# Patient Record
Sex: Female | Born: 1990 | Race: White | Hispanic: No | Marital: Married | Smoking: Current some day smoker
Health system: Southern US, Community
[De-identification: ages and names within clinical notes are randomized; demographics above are authoritative.]

## PROBLEM LIST (undated history)

## (undated) ENCOUNTER — Inpatient Hospital Stay (HOSPITAL_COMMUNITY): Payer: Self-pay

## (undated) DIAGNOSIS — O139 Gestational [pregnancy-induced] hypertension without significant proteinuria, unspecified trimester: Secondary | ICD-10-CM

## (undated) DIAGNOSIS — Z789 Other specified health status: Secondary | ICD-10-CM

## (undated) HISTORY — PX: CHOLECYSTECTOMY: SHX55

---
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
## 2018-09-22 ENCOUNTER — Encounter: Payer: Self-pay | Admitting: Family Medicine

## 2018-09-22 ENCOUNTER — Other Ambulatory Visit: Payer: Self-pay

## 2018-09-22 ENCOUNTER — Ambulatory Visit (INDEPENDENT_AMBULATORY_CARE_PROVIDER_SITE_OTHER): Payer: Self-pay | Admitting: *Deleted

## 2018-09-22 DIAGNOSIS — Z3200 Encounter for pregnancy test, result unknown: Secondary | ICD-10-CM

## 2018-09-22 DIAGNOSIS — Z3201 Encounter for pregnancy test, result positive: Secondary | ICD-10-CM

## 2018-09-22 LAB — POCT PREGNANCY, URINE: PREG TEST UR: POSITIVE — AB

## 2018-09-22 NOTE — Progress Notes (Signed)
Patient ID: Emily [name], female   DOB: [birth year], 28 y.o.   MRN: [id number] I have reviewed the chart and agree with nursing staff's documentation of this patient's encounter.  Scheryl Darter, MD 09/22/2018 11:31 AM

## 2018-09-22 NOTE — Progress Notes (Signed)
Here for pregnancy test which was positive. States LMP 07/25/18 which makes her [redacted]w[redacted]d with EDD 05/01/19. Advised to start prenatal care at 10 weeks or so. And to start prenatal vitamins. She voices understanding. Jeanita Carneiro,RN

## 2018-09-28 ENCOUNTER — Inpatient Hospital Stay (HOSPITAL_COMMUNITY)
Admission: AD | Admit: 2018-09-28 | Discharge: 2018-09-28 | Disposition: A | Discharge status: Home / Self Care | Payer: Medicaid Other | Attending: Obstetrics & Gynecology | Admitting: Obstetrics & Gynecology

## 2018-09-28 ENCOUNTER — Encounter (HOSPITAL_COMMUNITY): Payer: Self-pay

## 2018-09-28 ENCOUNTER — Inpatient Hospital Stay (HOSPITAL_COMMUNITY): Payer: Medicaid Other

## 2018-09-28 DIAGNOSIS — O219 Vomiting of pregnancy, unspecified: Secondary | ICD-10-CM | POA: Diagnosis not present

## 2018-09-28 DIAGNOSIS — O98811 Other maternal infectious and parasitic diseases complicating pregnancy, first trimester: Secondary | ICD-10-CM | POA: Diagnosis not present

## 2018-09-28 DIAGNOSIS — B3731 Acute candidiasis of vulva and vagina: Secondary | ICD-10-CM | POA: Diagnosis present

## 2018-09-28 DIAGNOSIS — Z3490 Encounter for supervision of normal pregnancy, unspecified, unspecified trimester: Secondary | ICD-10-CM

## 2018-09-28 DIAGNOSIS — R109 Unspecified abdominal pain: Secondary | ICD-10-CM | POA: Insufficient documentation

## 2018-09-28 DIAGNOSIS — R1031 Right lower quadrant pain: Secondary | ICD-10-CM | POA: Insufficient documentation

## 2018-09-28 DIAGNOSIS — O26899 Other specified pregnancy related conditions, unspecified trimester: Secondary | ICD-10-CM

## 2018-09-28 DIAGNOSIS — B373 Candidiasis of vulva and vagina: Secondary | ICD-10-CM | POA: Insufficient documentation

## 2018-09-28 DIAGNOSIS — Z3A09 9 weeks gestation of pregnancy: Secondary | ICD-10-CM | POA: Diagnosis not present

## 2018-09-28 DIAGNOSIS — Z87891 Personal history of nicotine dependence: Secondary | ICD-10-CM | POA: Diagnosis not present

## 2018-09-28 DIAGNOSIS — O26891 Other specified pregnancy related conditions, first trimester: Secondary | ICD-10-CM | POA: Diagnosis present

## 2018-09-28 HISTORY — DX: Other specified health status: Z78.9

## 2018-09-28 LAB — WET PREP, GENITAL
Clue Cells Wet Prep HPF POC: NONE SEEN
Sperm: NONE SEEN
Trich, Wet Prep: NONE SEEN

## 2018-09-28 LAB — URINALYSIS, ROUTINE W REFLEX MICROSCOPIC
Bilirubin Urine: NEGATIVE
Glucose, UA: NEGATIVE mg/dL
Hgb urine dipstick: NEGATIVE
Ketones, ur: NEGATIVE mg/dL
LEUKOCYTE UA: NEGATIVE
NITRITE: NEGATIVE
Protein, ur: NEGATIVE mg/dL
SPECIFIC GRAVITY, URINE: 1.01 (ref 1.005–1.030)
pH: 7 (ref 5.0–8.0)

## 2018-09-28 LAB — CBC
HEMATOCRIT: 42.8 % (ref 36.0–46.0)
HEMOGLOBIN: 14.2 g/dL (ref 12.0–15.0)
MCH: 30.7 pg (ref 26.0–34.0)
MCHC: 33.2 g/dL (ref 30.0–36.0)
MCV: 92.4 fL (ref 80.0–100.0)
NRBC: 0 % (ref 0.0–0.2)
Platelets: 335 10*3/uL (ref 150–400)
RBC: 4.63 MIL/uL (ref 3.87–5.11)
RDW: 12.1 % (ref 11.5–15.5)
WBC: 16.5 10*3/uL — AB (ref 4.0–10.5)

## 2018-09-28 LAB — HCG, QUANTITATIVE, PREGNANCY: HCG, BETA CHAIN, QUANT, S: 132657 m[IU]/mL — AB (ref <5)

## 2018-09-28 MED ORDER — TERCONAZOLE 0.4 % VA CREA: 1.0000 | TOPICAL_CREAM | Freq: Every day | VAGINAL | 0 refills | Status: AC

## 2018-09-28 MED ORDER — ACETAMINOPHEN 500 MG PO TABS
1000.0000 mg | ORAL_TABLET | Freq: Once | ORAL | Status: AC
  Administered 2018-09-28: 1000 mg via ORAL
  Filled 2018-09-28: qty 2

## 2018-09-28 NOTE — MAU Provider Note (Signed)
History     CSN: [id number]  Arrival date and time: 09/28/18 2120   First Provider Initiated Contact with Patient 09/28/18 2148      Chief Complaint  Patient presents with  . Abdominal Pain  . Nausea   HPI  Ms.  Emily [name] is a 28 y.o. year old G1P0 female at [redacted]w[redacted]d weeks gestation who presents to MAU reporting RT sided low abdominal pain that started "in the middle of the afternoon" today. She describes the pain as "sharp at times that comes and goes." She also reports that the pain has progressively gotten worse throughout the day. She also reports nausea and she vomited once today. She denies VB or abnormal vaginal d/c. Her last SI was 3-4 days ago. She had a (+) UPT at [facility] on 09/22/2018. She also complains of H/A today.  Past Medical History:  Diagnosis Date  . Medical history non-contributory     Past Surgical History:  Procedure Laterality Date  . CHOLECYSTECTOMY      History reviewed. No pertinent family history.  Social History   Tobacco Use  . Smoking status: Former Games developer  . Smokeless tobacco: Former Engineer, water Use Topics  . Alcohol use: Not on file  . Drug use: Never    Allergies:  Allergies  Allergen Reactions  . Amoxicillin Hives  . Fish Allergy Hives  . Sulfa Antibiotics Hives  . Azithromycin     Other reaction(s): GI Upset (intolerance)    No medications prior to admission.    Review of Systems  Constitutional: Negative.   HENT: Negative.   Eyes: Negative.   Respiratory: Negative.   Cardiovascular: Negative.   Gastrointestinal: Negative.   Endocrine: Negative.   Genitourinary: Positive for pelvic pain (lower RT, sharp, intermittent, progressively getting worse ).  Musculoskeletal: Negative.   Skin: Negative.   Allergic/Immunologic: Negative.   Neurological: Positive for headaches.  Hematological: Negative.   Psychiatric/Behavioral: Negative.    Physical Exam   Blood pressure (!) 128/91, pulse 61, temperature 98.1 F (36.7  C), temperature source Oral, resp. rate 18, height  (1.753 m), weight 79.8 kg, last menstrual period 07/25/2018, SpO2 100 %.  Physical Exam  Nursing note and vitals reviewed. Constitutional: She is oriented to person, place, and time. She appears well-developed and well-nourished.  HENT:  Head: Normocephalic and atraumatic.  Eyes: Pupils are equal, round, and reactive to light.  Neck: Normal range of motion.  Cardiovascular: Normal rate.  Respiratory: Effort normal.  GI: Soft.  Genitourinary:    Genitourinary Comments: Uterus: non-tender, SE: cervix is smooth, pink, no lesions, small amt of thick, white vaginal d/c -- WP, GC/CT done, closed/long/firm, no CMT or friability, no adnexal tenderness    Musculoskeletal: Normal range of motion.  Neurological: She is alert and oriented to person, place, and time.  Skin: Skin is warm and dry.  Psychiatric: She has a normal mood and affect. Her behavior is normal. Judgment and thought content normal.    MAU Course  Procedures  MDM CCUA UPT CBC ABO/Rh HCG Wet Prep GC/CT -- pending HIV -- pending OB < 14 wks [place] with TV Tylenol 1000 mg po  Results for orders placed or performed during the hospital encounter of 09/28/18 (from the past 24 hour(s))  CBC     Status: Abnormal   Collection Time: 09/28/18  9:49 PM  Result Value Ref Range   WBC 16.5 (H) 4.0 - 10.5 K/uL   RBC 4.63 3.87 - 5.11 MIL/uL   Hemoglobin  14.2 12.0 - 15.0 g/dL   HCT 10.2 72.5 - 36.6 %   MCV 92.4 80.0 - 100.0 fL   MCH 30.7 26.0 - 34.0 pg   MCHC 33.2 30.0 - 36.0 g/dL   RDW 44.0 34.7 - 42.5 %   Platelets 335 150 - 400 K/uL   nRBC 0.0 0.0 - 0.2 %  ABO/Rh     Status: None   Collection Time: 09/28/18  9:49 PM  Result Value Ref Range   ABO/RH(D)      B POS Performed at [facility], [address], [place], [place] [address]   hCG, quantitative, pregnancy     Status: Abnormal   Collection Time: 09/28/18  9:49 PM  Result Value Ref Range   hCG, Beta  Chain, Quant, S 132,657 (H) <5 mIU/mL  Wet prep, genital     Status: Abnormal   Collection Time: 09/28/18 10:31 PM  Result Value Ref Range   Yeast Wet Prep HPF POC PRESENT (A) NONE SEEN   Trich, Wet Prep NONE SEEN NONE SEEN   Clue Cells Wet Prep HPF POC NONE SEEN NONE SEEN   WBC, Wet Prep HPF POC MANY (A) NONE SEEN   Sperm NONE SEEN   Urinalysis, Routine w reflex microscopic     Status: Abnormal   Collection Time: 09/28/18 10:33 PM  Result Value Ref Range   Color, Urine YELLOW YELLOW   APPearance CLOUDY (A) CLEAR   Specific Gravity, Urine 1.010 1.005 - 1.030   pH 7.0 5.0 - 8.0   Glucose, UA NEGATIVE NEGATIVE mg/dL   Hgb urine dipstick NEGATIVE NEGATIVE   Bilirubin Urine NEGATIVE NEGATIVE   Ketones, ur NEGATIVE NEGATIVE mg/dL   Protein, ur NEGATIVE NEGATIVE mg/dL   Nitrite NEGATIVE NEGATIVE   Leukocytes,Ua NEGATIVE NEGATIVE    US Ob Less Than 14 Weeks With Ob Transvaginal  Result Date: 09/28/2018 CLINICAL DATA:  Right lower quadrant pain. Estimated gestational age by LMP is 9 weeks 2 days. No given quantitative beta HCG. EXAM: OBSTETRIC <14 WK [place] AND TRANSVAGINAL OB US TECHNIQUE: Both transabdominal and transvaginal ultrasound examinations were performed for complete evaluation of the gestation as well as the maternal uterus, adnexal regions, and pelvic cul-de-sac. Transvaginal technique was performed to assess early pregnancy. COMPARISON:  None. FINDINGS: Intrauterine gestational sac: A single intrauterine gestational sac is present. Yolk sac:  Yolk sac is present. Embryo:  Fetal pole is demonstrated. Cardiac Activity: Fetal cardiac activity is observed. Heart Rate: 180 bpm CRL: 21 mm   8 w   5 d                  [place] EDC: 05/05/2019 Subchorionic hemorrhage:  None visualized. Maternal uterus/adnexae: Uterus is retroverted. No myometrial mass lesions indicated. Both ovaries are visualized and appear normal. No abnormal pelvic fluid. IMPRESSION: Single intrauterine pregnancy. Estimated  gestational age by crown-rump length is 8 weeks 5 days. No acute complication is suggested on ultrasound. Electronically Signed   By: Burman Nieves M.D.   On: 09/28/2018 22:40    Assessment and Plan  Candida vaginitis - Rx Terazol 0.4% cream hs x 7 days - Information provided on yeast infection   Intrauterine pregnancy - Establish PNC with [facility], as planned - Information provided on 1st trimester preg   Abdominal pain during pregnancy in first trimester  - Advised to take Tylenol 1000 mg every 6 hrs prn pain - Safe OTC Meds in Preg list given - Information provided on abd pain in  preg   - Discharge patient - Schedule PNC with Dr. Henderson Cloud at [facility] in 2 wks (per pt request) - Patient verbalized an understanding of the plan of care and agrees.    Raelyn Mora, MSN, CNM 09/28/2018, 9:48 PM

## 2018-09-28 NOTE — MAU Note (Signed)
Pt here with c/o right sided abdominal pain since this AM; nausea and vomited once today. Confirmed pregnancy test at the clinic.

## 2018-09-28 NOTE — Discharge Instructions (Signed)
Safe Over-the-Counter Medications in Pregnancy   Acne: Benzoyl Peroxide Salicylic Acid  Backache/Headache: Tylenol: 2 regular strength every 4 hours OR              2 Extra strength every 6 hours  Colds/Coughs/Allergies: Benadryl (alcohol free) 25 mg every 6 hours as needed Breath right strips Claritin Cepacol throat lozenges Chloraseptic throat spray Cold-Eeze- up to three times per day Cough drops, alcohol free Flonase (by prescription only) Guaifenesin Mucinex Robitussin DM (plain only, alcohol free) Saline nasal spray/drops Sudafed (pseudoephedrine) & Actifed ** use only after [redacted] weeks gestation and if you do not have high blood pressure Tylenol Vicks Vaporub Zinc lozenges Zyrtec   Constipation: Colace Ducolax suppositories Fleet enema Glycerin suppositories Metamucil Milk of magnesia Miralax Senokot Smooth move tea  Diarrhea: Kaopectate Imodium A-D  *NO pepto Bismol  Hemorrhoids: Anusol Anusol HC Preparation H Tucks  Indigestion: Tums Maalox Mylanta Zantac  Pepcid  Insomnia: Benadryl (alcohol free) 25mg  every 6 hours as needed Tylenol PM Unisom, no Gelcaps  Leg Cramps: Tums MagGel  Nausea/Vomiting:  Bonine Dramamine Emetrol Ginger extract Sea bands Meclizine  Nausea medication to take during pregnancy:  Unisom (doxylamine succinate 25 mg tablets) Take one tablet daily at bedtime. If symptoms are not adequately controlled, the dose can be increased to a maximum recommended dose of two tablets daily (1/2 tablet in the morning, 1/2 tablet mid-afternoon and one at bedtime). Vitamin B6 100mg  tablets. Take one tablet twice a day (up to 200 mg per day).  Skin Rashes: Aveeno products Benadryl cream or 25mg  every 6 hours as needed Calamine Lotion 1% cortisone cream  Yeast infection: Gyne-lotrimin 7 Monistat 7   **If taking multiple medications, please check labels to avoid duplicating the same active ingredients **take medication  as directed on the label ** Do not exceed 4000 mg of tylenol in 24 hours **Do not take medications that contain aspirin or ibuprofen

## 2018-09-29 LAB — GC/CHLAMYDIA PROBE AMP (~~LOC~~) NOT AT ARMC
Chlamydia: NEGATIVE
Neisseria Gonorrhea: NEGATIVE

## 2018-09-29 LAB — ABO/RH: ABO/RH(D): B POS

## 2018-09-29 LAB — HIV ANTIBODY (ROUTINE TESTING W REFLEX): HIV Screen 4th Generation wRfx: NONREACTIVE

## 2018-10-30 LAB — OB RESULTS CONSOLE RUBELLA ANTIBODY, IGM: Rubella: IMMUNE

## 2018-10-30 LAB — OB RESULTS CONSOLE HEPATITIS B SURFACE ANTIGEN: Hepatitis B Surface Ag: NEGATIVE

## 2018-10-30 LAB — OB RESULTS CONSOLE RPR: RPR: NONREACTIVE

## 2019-02-27 ENCOUNTER — Other Ambulatory Visit: Payer: Self-pay

## 2019-04-09 ENCOUNTER — Other Ambulatory Visit: Payer: Self-pay | Admitting: Obstetrics and Gynecology

## 2019-04-10 ENCOUNTER — Other Ambulatory Visit: Payer: Self-pay

## 2019-04-10 ENCOUNTER — Encounter (HOSPITAL_COMMUNITY): Payer: Self-pay

## 2019-04-10 ENCOUNTER — Inpatient Hospital Stay (HOSPITAL_COMMUNITY)
Admission: AD | Admit: 2019-04-10 | Discharge: 2019-04-14 | DRG: 787 | Disposition: A | Discharge status: Home / Self Care | Payer: Medicaid Other | Attending: Obstetrics and Gynecology | Admitting: Obstetrics and Gynecology

## 2019-04-10 ENCOUNTER — Inpatient Hospital Stay (HOSPITAL_COMMUNITY): Payer: Medicaid Other

## 2019-04-10 DIAGNOSIS — D62 Acute posthemorrhagic anemia: Secondary | ICD-10-CM | POA: Diagnosis not present

## 2019-04-10 DIAGNOSIS — O99214 Obesity complicating childbirth: Secondary | ICD-10-CM | POA: Diagnosis present

## 2019-04-10 DIAGNOSIS — O9081 Anemia of the puerperium: Secondary | ICD-10-CM | POA: Diagnosis not present

## 2019-04-10 DIAGNOSIS — O365930 Maternal care for other known or suspected poor fetal growth, third trimester, not applicable or unspecified: Secondary | ICD-10-CM | POA: Diagnosis present

## 2019-04-10 DIAGNOSIS — O1414 Severe pre-eclampsia complicating childbirth: Principal | ICD-10-CM | POA: Diagnosis present

## 2019-04-10 DIAGNOSIS — E669 Obesity, unspecified: Secondary | ICD-10-CM | POA: Diagnosis present

## 2019-04-10 DIAGNOSIS — O139 Gestational [pregnancy-induced] hypertension without significant proteinuria, unspecified trimester: Secondary | ICD-10-CM | POA: Diagnosis present

## 2019-04-10 DIAGNOSIS — F17210 Nicotine dependence, cigarettes, uncomplicated: Secondary | ICD-10-CM | POA: Diagnosis present

## 2019-04-10 DIAGNOSIS — Z20828 Contact with and (suspected) exposure to other viral communicable diseases: Secondary | ICD-10-CM | POA: Diagnosis present

## 2019-04-10 DIAGNOSIS — Z3A37 37 weeks gestation of pregnancy: Secondary | ICD-10-CM | POA: Diagnosis not present

## 2019-04-10 DIAGNOSIS — Z98891 History of uterine scar from previous surgery: Secondary | ICD-10-CM

## 2019-04-10 DIAGNOSIS — O99334 Smoking (tobacco) complicating childbirth: Secondary | ICD-10-CM | POA: Diagnosis present

## 2019-04-10 DIAGNOSIS — Z3490 Encounter for supervision of normal pregnancy, unspecified, unspecified trimester: Secondary | ICD-10-CM

## 2019-04-10 HISTORY — DX: Gestational (pregnancy-induced) hypertension without significant proteinuria, unspecified trimester: O13.9

## 2019-04-10 LAB — COMPREHENSIVE METABOLIC PANEL
ALT: 16 U/L (ref 0–44)
AST: 17 U/L (ref 15–41)
Albumin: 2.8 g/dL — ABNORMAL LOW (ref 3.5–5.0)
Alkaline Phosphatase: 111 U/L (ref 38–126)
Anion gap: 12 (ref 5–15)
BUN: <5 mg/dL — ABNORMAL LOW (ref 6–20)
CO2: 21 mmol/L — ABNORMAL LOW (ref 22–32)
Calcium: 9.3 mg/dL (ref 8.9–10.3)
Chloride: 105 mmol/L (ref 98–111)
Creatinine, Ser: 0.84 mg/dL (ref 0.44–1.00)
GFR calc Af Amer: >60 mL/min (ref >60)
GFR calc non Af Amer: >60 mL/min (ref >60)
Glucose, Bld: 97 mg/dL (ref 70–99)
Potassium: 2.8 mmol/L — ABNORMAL LOW (ref 3.5–5.1)
Sodium: 138 mmol/L (ref 135–145)
Total Bilirubin: 0.6 mg/dL (ref 0.3–1.2)
Total Protein: 6.4 g/dL — ABNORMAL LOW (ref 6.5–8.1)

## 2019-04-10 LAB — SARS CORONAVIRUS 2 BY RT PCR (HOSPITAL ORDER, PERFORMED IN ~~LOC~~ HOSPITAL LAB): SARS Coronavirus 2: NEGATIVE

## 2019-04-10 LAB — CBC
HCT: 32.9 % — ABNORMAL LOW (ref 36.0–46.0)
Hemoglobin: 11.5 g/dL — ABNORMAL LOW (ref 12.0–15.0)
MCH: 32.3 pg (ref 26.0–34.0)
MCHC: 35 g/dL (ref 30.0–36.0)
MCV: 92.4 fL (ref 80.0–100.0)
Platelets: 251 10*3/uL (ref 150–400)
RBC: 3.56 MIL/uL — ABNORMAL LOW (ref 3.87–5.11)
RDW: 11.9 % (ref 11.5–15.5)
WBC: 15.5 10*3/uL — ABNORMAL HIGH (ref 4.0–10.5)
nRBC: 0 % (ref 0.0–0.2)

## 2019-04-10 LAB — PROTEIN / CREATININE RATIO, URINE
Creatinine, Urine: 201.16 mg/dL
Protein Creatinine Ratio: 0.13 mg/mg{Cre} (ref 0.00–0.15)
Total Protein, Urine: 27 mg/dL

## 2019-04-10 LAB — MAGNESIUM: Magnesium: 5.4 mg/dL — ABNORMAL HIGH (ref 1.7–2.4)

## 2019-04-10 MED ORDER — TERBUTALINE SULFATE 1 MG/ML IJ SOLN: 0.2500 mg | Freq: Once | INTRAMUSCULAR | Status: DC | PRN

## 2019-04-10 MED ORDER — OXYCODONE-ACETAMINOPHEN 5-325 MG PO TABS: 1.0000 | ORAL_TABLET | ORAL | Status: DC | PRN

## 2019-04-10 MED ORDER — FLEET ENEMA 7-19 GM/118ML RE ENEM: 1.0000 | ENEMA | RECTAL | Status: DC | PRN

## 2019-04-10 MED ORDER — LACTATED RINGERS IV SOLN
INTRAVENOUS | Status: DC
  Administered 2019-04-10 – 2019-04-11 (×5): via INTRAVENOUS

## 2019-04-10 MED ORDER — ONDANSETRON HCL 4 MG/2ML IJ SOLN
4.0000 mg | Freq: Four times a day (QID) | INTRAMUSCULAR | Status: DC | PRN
  Administered 2019-04-11 (×2): 4 mg via INTRAVENOUS
  Filled 2019-04-10: qty 2

## 2019-04-10 MED ORDER — OXYTOCIN 40 UNITS IN NORMAL SALINE INFUSION - SIMPLE MED: 2.5000 [IU]/h | INTRAVENOUS | Status: DC

## 2019-04-10 MED ORDER — HYDRALAZINE HCL 20 MG/ML IJ SOLN: 10.0000 mg | INTRAMUSCULAR | Status: DC | PRN

## 2019-04-10 MED ORDER — MAGNESIUM SULFATE BOLUS VIA INFUSION
4.0000 g | Freq: Once | INTRAVENOUS | Status: AC
  Administered 2019-04-10: 4 g via INTRAVENOUS
  Filled 2019-04-10: qty 500

## 2019-04-10 MED ORDER — LABETALOL HCL 5 MG/ML IV SOLN
40.0000 mg | INTRAVENOUS | Status: DC | PRN
  Administered 2019-04-10 – 2019-04-11 (×2): 40 mg via INTRAVENOUS
  Filled 2019-04-10 (×2): qty 8

## 2019-04-10 MED ORDER — OXYTOCIN 40 UNITS IN NORMAL SALINE INFUSION - SIMPLE MED
1.0000 m[IU]/min | INTRAVENOUS | Status: DC
  Administered 2019-04-10: 2 m[IU]/min via INTRAVENOUS
  Filled 2019-04-10: qty 1000

## 2019-04-10 MED ORDER — LIDOCAINE HCL (PF) 1 % IJ SOLN: 30.0000 mL | INTRAMUSCULAR | Status: DC | PRN

## 2019-04-10 MED ORDER — SOD CITRATE-CITRIC ACID 500-334 MG/5ML PO SOLN
30.0000 mL | ORAL | Status: DC | PRN
  Administered 2019-04-11: 30 mL via ORAL
  Filled 2019-04-10: qty 30

## 2019-04-10 MED ORDER — LABETALOL HCL 5 MG/ML IV SOLN
20.0000 mg | INTRAVENOUS | Status: DC | PRN
  Administered 2019-04-10 – 2019-04-11 (×5): 20 mg via INTRAVENOUS
  Filled 2019-04-10 (×5): qty 4

## 2019-04-10 MED ORDER — LACTATED RINGERS IV SOLN
500.0000 mL | INTRAVENOUS | Status: DC | PRN
  Administered 2019-04-11: 500 mL via INTRAVENOUS

## 2019-04-10 MED ORDER — OXYCODONE-ACETAMINOPHEN 5-325 MG PO TABS: 2.0000 | ORAL_TABLET | ORAL | Status: DC | PRN

## 2019-04-10 MED ORDER — OXYTOCIN BOLUS FROM INFUSION: 500.0000 mL | Freq: Once | INTRAVENOUS | Status: DC

## 2019-04-10 MED ORDER — MISOPROSTOL 25 MCG QUARTER TABLET
25.0000 ug | ORAL_TABLET | ORAL | Status: DC | PRN
  Administered 2019-04-10 (×3): 25 ug via VAGINAL
  Filled 2019-04-10 (×3): qty 1

## 2019-04-10 MED ORDER — MAGNESIUM SULFATE 40 G IN LACTATED RINGERS - SIMPLE
2.0000 g/h | INTRAVENOUS | Status: DC
  Administered 2019-04-11 – 2019-04-12 (×2): 2 g/h via INTRAVENOUS
  Filled 2019-04-10 (×4): qty 500

## 2019-04-10 MED ORDER — ACETAMINOPHEN 325 MG PO TABS
650.0000 mg | ORAL_TABLET | ORAL | Status: DC | PRN
  Administered 2019-04-10 – 2019-04-11 (×5): 650 mg via ORAL
  Filled 2019-04-10 (×5): qty 2

## 2019-04-10 MED ORDER — LABETALOL HCL 5 MG/ML IV SOLN: 80.0000 mg | INTRAVENOUS | Status: DC | PRN

## 2019-04-10 NOTE — H&P (Signed)
Emily [name] is an 28 y.o. G1P0 [redacted]w[redacted]d white female who is admitted for induction for preeclampsia and IUGROn arrival this am she had a headache and severe range B/Ps. She was started on the HTN protocol and MgSO4. She has an unfavorable cx. Will receive cytotec. GBS-neg/NIPT-wnl/OGTT-wnl  Past Medical History:  Diagnosis Date  . Medical history non-contributory   . Pregnancy induced hypertension     Past Surgical History:  Procedure Laterality Date  . CHOLECYSTECTOMY      History reviewed. No pertinent family history. Social History:  reports that she has been smoking cigarettes. She has never used smokeless tobacco. She reports previous alcohol use. She reports that she does not use drugs.  Allergies:  Allergies  Allergen Reactions  . Amoxicillin Hives  . Fish Allergy Hives  . Shellfish Allergy Anaphylaxis    Pt states she has used iodine on her skin before without reaction.  . Sulfa Antibiotics Hives  . Azithromycin     Other reaction(s): GI Upset (intolerance)    Medications Prior to Admission  Medication Sig Dispense Refill  . acetaminophen (TYLENOL) 500 MG tablet Take 500 mg by mouth every 6 (six) hours as needed for mild pain or headache.    . calcium carbonate (TUMS - DOSED IN MG ELEMENTAL CALCIUM) 500 MG chewable tablet Chew 2 tablets by mouth as needed for indigestion or heartburn.    . diphenhydrAMINE (BENADRYL) 25 MG tablet Take 25 mg by mouth at bedtime as needed for allergies or sleep.    . Prenatal Vit-Fe Fumarate-FA (PRENATAL VITAMINS PLUS PO) Take 1 tablet by mouth daily.    . psyllium (METAMUCIL) 58.6 % packet Take 1 packet by mouth as needed (constipation).         Blood pressure 123/70, pulse 82, temperature 98.2 F (36.8 C), temperature source Oral, resp. rate 17, height 5\' 9"  (1.753 m), weight 94.3 kg, last menstrual period 07/25/2018, SpO2 99 %. General appearance: alert and cooperative Abdomen: gravid, non tender   Lab Results  Component Value  Date   WBC 15.5 (H) 04/10/2019   HGB 11.5 (L) 04/10/2019   HCT 32.9 (L) 04/10/2019   MCV 92.4 04/10/2019   PLT 251 04/10/2019   Lab Results  Component Value Date   PREGTESTUR POSITIVE (A) 09/22/2018      Patient Active Problem List   Diagnosis Date Noted  . Gestational hypertension 04/10/2019  . Candida vaginitis 09/28/2018  . Intrauterine pregnancy 09/28/2018  . Abdominal pain during pregnancy in first trimester 09/28/2018   IMP/IUP at 37 weeks with IUGR and preeclampsia Plan/ Admit and start induction  ANDERSON,MARK E 04/10/2019, 11:54 PM

## 2019-04-10 NOTE — MAU Note (Signed)
Covid swab collected. PT tolerated well. PT asymptomatic 

## 2019-04-11 ENCOUNTER — Inpatient Hospital Stay (HOSPITAL_COMMUNITY): Payer: Medicaid Other | Admitting: Anesthesiology

## 2019-04-11 ENCOUNTER — Encounter (HOSPITAL_COMMUNITY)
Admission: AD | Disposition: A | Discharge status: Home / Self Care | Payer: Self-pay | Source: Home / Self Care | Attending: Obstetrics and Gynecology

## 2019-04-11 LAB — CBC
HCT: 32.4 % — ABNORMAL LOW (ref 36.0–46.0)
Hemoglobin: 11.4 g/dL — ABNORMAL LOW (ref 12.0–15.0)
MCH: 32.5 pg (ref 26.0–34.0)
MCHC: 35.2 g/dL (ref 30.0–36.0)
MCV: 92.3 fL (ref 80.0–100.0)
Platelets: 239 10*3/uL (ref 150–400)
RBC: 3.51 MIL/uL — ABNORMAL LOW (ref 3.87–5.11)
RDW: 12.3 % (ref 11.5–15.5)
WBC: 17.3 10*3/uL — ABNORMAL HIGH (ref 4.0–10.5)
nRBC: 0 % (ref 0.0–0.2)

## 2019-04-11 LAB — RPR: RPR Ser Ql: NONREACTIVE

## 2019-04-11 SURGERY — Surgical Case
Anesthesia: Epidural

## 2019-04-11 MED ORDER — ONDANSETRON HCL 4 MG/2ML IJ SOLN: 4.0000 mg | Freq: Three times a day (TID) | INTRAMUSCULAR | Status: DC | PRN

## 2019-04-11 MED ORDER — DIPHENHYDRAMINE HCL 50 MG/ML IJ SOLN: 12.5000 mg | Freq: Four times a day (QID) | INTRAMUSCULAR | Status: DC | PRN

## 2019-04-11 MED ORDER — METHYLERGONOVINE MALEATE 0.2 MG/ML IJ SOLN
INTRAMUSCULAR | Status: AC
  Filled 2019-04-11: qty 1

## 2019-04-11 MED ORDER — CEFAZOLIN SODIUM-DEXTROSE 2-3 GM-%(50ML) IV SOLR
INTRAVENOUS | Status: DC | PRN
  Administered 2019-04-11: 2 g via INTRAVENOUS

## 2019-04-11 MED ORDER — PHENYLEPHRINE 40 MCG/ML (10ML) SYRINGE FOR IV PUSH (FOR BLOOD PRESSURE SUPPORT)
80.0000 ug | PREFILLED_SYRINGE | INTRAVENOUS | Status: DC | PRN
  Administered 2019-04-11: 80 ug via INTRAVENOUS

## 2019-04-11 MED ORDER — EPHEDRINE 5 MG/ML INJ: 10.0000 mg | INTRAVENOUS | Status: DC | PRN

## 2019-04-11 MED ORDER — CEFAZOLIN SODIUM-DEXTROSE 2-4 GM/100ML-% IV SOLN
INTRAVENOUS | Status: AC
  Filled 2019-04-11: qty 100

## 2019-04-11 MED ORDER — MEPERIDINE HCL 25 MG/ML IJ SOLN: 6.2500 mg | INTRAMUSCULAR | Status: DC | PRN

## 2019-04-11 MED ORDER — ACETAMINOPHEN 10 MG/ML IV SOLN
1000.0000 mg | Freq: Once | INTRAVENOUS | Status: DC | PRN
  Administered 2019-04-12: 1000 mg via INTRAVENOUS

## 2019-04-11 MED ORDER — FENTANYL CITRATE (PF) 100 MCG/2ML IJ SOLN
INTRAMUSCULAR | Status: AC
  Filled 2019-04-11: qty 2

## 2019-04-11 MED ORDER — OXYCODONE HCL 5 MG PO TABS: 5.0000 mg | ORAL_TABLET | Freq: Once | ORAL | Status: DC | PRN

## 2019-04-11 MED ORDER — NALBUPHINE HCL 10 MG/ML IJ SOLN
5.0000 mg | Freq: Once | INTRAMUSCULAR | Status: DC | PRN
  Filled 2019-04-11: qty 0.5

## 2019-04-11 MED ORDER — FENTANYL CITRATE (PF) 100 MCG/2ML IJ SOLN
INTRAMUSCULAR | Status: DC | PRN
  Administered 2019-04-11: 100 ug via EPIDURAL

## 2019-04-11 MED ORDER — MORPHINE SULFATE (PF) 0.5 MG/ML IJ SOLN
INTRAMUSCULAR | Status: AC
  Filled 2019-04-11: qty 10

## 2019-04-11 MED ORDER — DIPHENHYDRAMINE HCL 50 MG/ML IJ SOLN: 12.5000 mg | INTRAMUSCULAR | Status: DC | PRN

## 2019-04-11 MED ORDER — NALOXONE HCL 0.4 MG/ML IJ SOLN: 0.4000 mg | INTRAMUSCULAR | Status: DC | PRN

## 2019-04-11 MED ORDER — EPHEDRINE 5 MG/ML INJ
INTRAVENOUS | Status: AC
  Filled 2019-04-11: qty 10

## 2019-04-11 MED ORDER — SCOPOLAMINE 1 MG/3DAYS TD PT72: 1.0000 | MEDICATED_PATCH | Freq: Once | TRANSDERMAL | Status: DC

## 2019-04-11 MED ORDER — OXYTOCIN 10 UNIT/ML IJ SOLN
INTRAMUSCULAR | Status: AC
  Filled 2019-04-11: qty 4

## 2019-04-11 MED ORDER — SODIUM CHLORIDE 0.9% FLUSH: 3.0000 mL | INTRAVENOUS | Status: DC | PRN

## 2019-04-11 MED ORDER — LIDOCAINE HCL (PF) 1 % IJ SOLN
INTRAMUSCULAR | Status: DC | PRN
  Administered 2019-04-11 (×2): 5 mL via EPIDURAL

## 2019-04-11 MED ORDER — PHENYLEPHRINE 40 MCG/ML (10ML) SYRINGE FOR IV PUSH (FOR BLOOD PRESSURE SUPPORT)
PREFILLED_SYRINGE | INTRAVENOUS | Status: AC
  Filled 2019-04-11: qty 10

## 2019-04-11 MED ORDER — HYDROMORPHONE HCL 1 MG/ML IJ SOLN
0.2500 mg | INTRAMUSCULAR | Status: DC | PRN
  Administered 2019-04-12: 0.5 mg via INTRAVENOUS

## 2019-04-11 MED ORDER — LACTATED RINGERS IV SOLN
500.0000 mL | Freq: Once | INTRAVENOUS | Status: AC
  Administered 2019-04-11: 500 mL via INTRAVENOUS

## 2019-04-11 MED ORDER — KETOROLAC TROMETHAMINE 30 MG/ML IJ SOLN
30.0000 mg | Freq: Once | INTRAMUSCULAR | Status: AC
  Administered 2019-04-12: 30 mg via INTRAVENOUS

## 2019-04-11 MED ORDER — NALBUPHINE HCL 10 MG/ML IJ SOLN
5.0000 mg | INTRAMUSCULAR | Status: DC | PRN
  Filled 2019-04-11: qty 0.5

## 2019-04-11 MED ORDER — PHENYLEPHRINE HCL (PRESSORS) 10 MG/ML IV SOLN
INTRAVENOUS | Status: DC | PRN
  Administered 2019-04-11 (×3): 80 ug via INTRAVENOUS

## 2019-04-11 MED ORDER — LIDOCAINE-EPINEPHRINE (PF) 2 %-1:200000 IJ SOLN
INTRAMUSCULAR | Status: DC | PRN
  Administered 2019-04-11: 10 mL via INTRADERMAL
  Administered 2019-04-11: 5 mL via INTRADERMAL

## 2019-04-11 MED ORDER — DIPHENHYDRAMINE HCL 25 MG PO CAPS: 25.0000 mg | ORAL_CAPSULE | ORAL | Status: DC | PRN

## 2019-04-11 MED ORDER — LIDOCAINE-EPINEPHRINE (PF) 2 %-1:200000 IJ SOLN
INTRAMUSCULAR | Status: AC
  Filled 2019-04-11: qty 10

## 2019-04-11 MED ORDER — SODIUM CHLORIDE (PF) 0.9 % IJ SOLN
INTRAMUSCULAR | Status: DC | PRN
  Administered 2019-04-11: 12 mL/h via EPIDURAL

## 2019-04-11 MED ORDER — OXYCODONE HCL 5 MG/5ML PO SOLN: 5.0000 mg | Freq: Once | ORAL | Status: DC | PRN

## 2019-04-11 MED ORDER — FENTANYL-BUPIVACAINE-NACL 0.5-0.125-0.9 MG/250ML-% EP SOLN
12.0000 mL/h | EPIDURAL | Status: DC | PRN
  Administered 2019-04-11: 12 mL/h via EPIDURAL
  Filled 2019-04-11: qty 250

## 2019-04-11 MED ORDER — PROMETHAZINE HCL 25 MG/ML IJ SOLN: 6.2500 mg | INTRAMUSCULAR | Status: DC | PRN

## 2019-04-11 MED ORDER — PHENYLEPHRINE 40 MCG/ML (10ML) SYRINGE FOR IV PUSH (FOR BLOOD PRESSURE SUPPORT)
80.0000 ug | PREFILLED_SYRINGE | INTRAVENOUS | Status: DC | PRN
  Filled 2019-04-11: qty 10

## 2019-04-11 MED ORDER — MORPHINE SULFATE (PF) 0.5 MG/ML IJ SOLN
INTRAMUSCULAR | Status: DC | PRN
  Administered 2019-04-11: 3 mg via EPIDURAL

## 2019-04-11 MED ORDER — EPHEDRINE 5 MG/ML INJ
10.0000 mg | INTRAVENOUS | Status: DC | PRN
  Administered 2019-04-11: 10 mg via INTRAVENOUS

## 2019-04-11 MED ORDER — ONDANSETRON HCL 4 MG/2ML IJ SOLN
INTRAMUSCULAR | Status: AC
  Filled 2019-04-11: qty 2

## 2019-04-11 MED ORDER — OXYTOCIN 10 UNIT/ML IJ SOLN
INTRAMUSCULAR | Status: DC | PRN
  Administered 2019-04-11: 40 [IU]

## 2019-04-11 MED ORDER — NALOXONE HCL 4 MG/10ML IJ SOLN
1.0000 ug/kg/h | INTRAVENOUS | Status: DC | PRN
  Filled 2019-04-11: qty 5

## 2019-04-11 SURGICAL SUPPLY — 27 items
CHLORAPREP W/TINT 26ML (MISCELLANEOUS) ×3 IMPLANT
CLAMP CORD UMBIL (MISCELLANEOUS) IMPLANT
CLOTH BEACON ORANGE TIMEOUT ST (SAFETY) ×3 IMPLANT
DRSG OPSITE POSTOP 4X10 (GAUZE/BANDAGES/DRESSINGS) ×3 IMPLANT
ELECT REM PT RETURN 9FT ADLT (ELECTROSURGICAL) ×3
ELECTRODE REM PT RTRN 9FT ADLT (ELECTROSURGICAL) ×1 IMPLANT
EXTRACTOR VACUUM M CUP 4 TUBE (SUCTIONS) IMPLANT
EXTRACTOR VACUUM M CUP 4' TUBE (SUCTIONS)
GLOVE BIOGEL PI IND STRL 7.0 (GLOVE) ×1 IMPLANT
GLOVE BIOGEL PI INDICATOR 7.0 (GLOVE) ×2
GLOVE ECLIPSE 7.0 STRL STRAW (GLOVE) ×6 IMPLANT
GOWN STRL REUS W/TWL LRG LVL3 (GOWN DISPOSABLE) ×6 IMPLANT
KIT ABG SYR 3ML LUER SLIP (SYRINGE) IMPLANT
NEEDLE HYPO 25X5/8 SAFETYGLIDE (NEEDLE) IMPLANT
NS IRRIG 1000ML POUR BTL (IV SOLUTION) ×3 IMPLANT
PACK C SECTION WH (CUSTOM PROCEDURE TRAY) ×3 IMPLANT
PAD OB MATERNITY 4.3X12.25 (PERSONAL CARE ITEMS) ×3 IMPLANT
RETRACTOR TRAXI PANNICULUS (MISCELLANEOUS) ×1 IMPLANT
SUT MNCRL 0 VIOLET CTX 36 (SUTURE) ×3 IMPLANT
SUT MON AB 2-0 CT1 27 (SUTURE) ×6 IMPLANT
SUT MONOCRYL 0 CTX 36 (SUTURE) ×6
SUT PLAIN 0 NONE (SUTURE) IMPLANT
SUT PLAIN 2 0 XLH (SUTURE) ×3 IMPLANT
TOWEL OR 17X24 6PK STRL BLUE (TOWEL DISPOSABLE) ×3 IMPLANT
TRAXI PANNICULUS RETRACTOR (MISCELLANEOUS) ×2
TRAY FOLEY W/BAG SLVR 14FR LF (SET/KITS/TRAYS/PACK) IMPLANT
WATER STERILE IRR 1000ML POUR (IV SOLUTION) ×3 IMPLANT

## 2019-04-11 NOTE — Anesthesia Procedure Notes (Signed)
Epidural Patient location during procedure: OB Start time: 04/11/2019 11:41 AM End time: 04/11/2019 11:51 AM  Staffing Anesthesiologist: Murvin Natal, MD Performed: anesthesiologist   Preanesthetic Checklist Completed: patient identified, site marked, pre-op evaluation, timeout performed, IV checked, risks and benefits discussed and monitors and equipment checked  Epidural Patient position: sitting Prep: DuraPrep Patient monitoring: heart rate, cardiac monitor, continuous pulse ox and blood pressure Approach: midline Location: L4-L5 Injection technique: LOR air  Needle:  Needle type: Tuohy  Needle gauge: 17 G Needle length: 9 cm Needle insertion depth: 7 cm Catheter type: closed end flexible Catheter size: 19 Gauge Catheter at skin depth: 12 cm Test dose: negative and Other  Assessment Events: blood not aspirated, injection not painful, no injection resistance and negative IV test  Additional Notes Informed consent obtained prior to proceeding including risk of failure, 1% risk of PDPH, risk of minor discomfort and bruising. Discussed alternatives to epidural analgesia and patient desires to proceed.  Timeout performed pre-procedure verifying patient name, procedure, and platelet count.  Patient tolerated procedure well. Reason for block:procedure for pain

## 2019-04-11 NOTE — Progress Notes (Signed)
Nursing service reports that the patient has made no cervical change since 10 am. She has had adequate MDUs the majority of the time . Discussed with pt and spouse reasoning for proceeding to C/S.

## 2019-04-11 NOTE — Plan of Care (Signed)
  Problem: Education: Goal: Knowledge of Childbirth will improve Outcome: Progressing Goal: Ability to make informed decisions regarding treatment and plan of care will improve Outcome: Progressing Goal: Ability to state and carry out methods to decrease the pain will improve Outcome: Progressing Goal: Individualized Educational Video(s) Outcome: Progressing   Problem: Coping: Goal: Ability to verbalize concerns and feelings about labor and delivery will improve Outcome: Progressing   Problem: Life Cycle: Goal: Ability to make normal progression through stages of labor will improve Outcome: Progressing Goal: Ability to effectively push during vaginal delivery will improve Outcome: Progressing   Problem: Role Relationship: Goal: Will demonstrate positive interactions with the child Outcome: Progressing   Problem: Safety: Goal: Risk of complications during labor and delivery will decrease Outcome: Progressing   Problem: Pain Management: Goal: Relief or control of pain from uterine contractions will improve Outcome: Progressing   Problem: Education: Goal: Knowledge of disease or condition will improve Outcome: Progressing Goal: Knowledge of the prescribed therapeutic regimen will improve Outcome: Progressing   Problem: Fluid Volume: Goal: Peripheral tissue perfusion will improve Outcome: Progressing   Problem: Clinical Measurements: Goal: Complications related to disease process, condition or treatment will be avoided or minimized Outcome: Progressing   Problem: Education: Goal: Knowledge of General Education information will improve Description: Including pain rating scale, medication(s)/side effects and non-pharmacologic comfort measures Outcome: Progressing   Problem: Health Behavior/Discharge Planning: Goal: Ability to manage health-related needs will improve Outcome: Progressing   Problem: Clinical Measurements: Goal: Ability to maintain clinical measurements  within normal limits will improve Outcome: Progressing Goal: Will remain free from infection Outcome: Progressing Goal: Diagnostic test results will improve Outcome: Progressing Goal: Respiratory complications will improve Outcome: Progressing Goal: Cardiovascular complication will be avoided Outcome: Progressing   Problem: Activity: Goal: Risk for activity intolerance will decrease Outcome: Progressing   Problem: Coping: Goal: Level of anxiety will decrease Outcome: Progressing   Problem: Elimination: Goal: Will not experience complications related to bowel motility Outcome: Progressing Goal: Will not experience complications related to urinary retention Outcome: Progressing   Problem: Safety: Goal: Ability to remain free from injury will improve Outcome: Progressing

## 2019-04-11 NOTE — Progress Notes (Signed)
Pt continues to have mild headache. She is on tylenol. She also has had to continue to be txd with labetalol for HTN. She is now on Pitocin and her cx is 50/3/-2 vtx. An IUPC was just placed to better assess ctx strength. Baby without decels. She is starting to have significant pain from the ctxs

## 2019-04-11 NOTE — Plan of Care (Signed)
  Problem: Education: Goal: Knowledge of Childbirth will improve Outcome: Progressing Goal: Ability to make informed decisions regarding treatment and plan of care will improve Outcome: Progressing Goal: Ability to state and carry out methods to decrease the pain will improve Outcome: Progressing Goal: Individualized Educational Video(s) Outcome: Progressing   Problem: Coping: Goal: Ability to verbalize concerns and feelings about labor and delivery will improve Outcome: Progressing   Problem: Life Cycle: Goal: Ability to make normal progression through stages of labor will improve Outcome: Progressing Goal: Ability to effectively push during vaginal delivery will improve Outcome: Progressing   Problem: Role Relationship: Goal: Will demonstrate positive interactions with the child Outcome: Progressing   Problem: Safety: Goal: Risk of complications during labor and delivery will decrease Outcome: Progressing   Problem: Pain Management: Goal: Relief or control of pain from uterine contractions will improve Outcome: Progressing   Problem: Education: Goal: Knowledge of disease or condition will improve Outcome: Progressing Goal: Knowledge of the prescribed therapeutic regimen will improve Outcome: Progressing   Problem: Fluid Volume: Goal: Peripheral tissue perfusion will improve Outcome: Progressing   Problem: Clinical Measurements: Goal: Complications related to disease process, condition or treatment will be avoided or minimized Outcome: Progressing   Problem: Education: Goal: Knowledge of General Education information will improve Description: Including pain rating scale, medication(s)/side effects and non-pharmacologic comfort measures Outcome: Progressing   Problem: Health Behavior/Discharge Planning: Goal: Ability to manage health-related needs will improve Outcome: Progressing   Problem: Clinical Measurements: Goal: Ability to maintain clinical measurements  within normal limits will improve Outcome: Progressing Goal: Will remain free from infection Outcome: Progressing Goal: Diagnostic test results will improve Outcome: Progressing Goal: Respiratory complications will improve Outcome: Progressing Goal: Cardiovascular complication will be avoided Outcome: Progressing   Problem: Activity: Goal: Risk for activity intolerance will decrease Outcome: Progressing   Problem: Coping: Goal: Level of anxiety will decrease Outcome: Progressing   Problem: Elimination: Goal: Will not experience complications related to bowel motility Outcome: Progressing Goal: Will not experience complications related to urinary retention Outcome: Progressing   Problem: Safety: Goal: Ability to remain free from injury will improve Outcome: Progressing   

## 2019-04-11 NOTE — Anesthesia Preprocedure Evaluation (Signed)
Anesthesia Evaluation  Patient identified by MRN, date of birth, ID band Patient awake    Reviewed: Allergy & Precautions, H&P , NPO status , Patient's Chart, lab work & pertinent test results  History of Anesthesia Complications Negative for: history of anesthetic complications  Airway Mallampati: II  TM Distance: >3 FB Neck ROM: full    Dental no notable dental hx. (+) Teeth Intact   Pulmonary Current Smoker,    Pulmonary exam normal breath sounds clear to auscultation       Cardiovascular hypertension, Normal cardiovascular exam Rhythm:regular Rate:Normal  PIH   Neuro/Psych negative neurological ROS  negative psych ROS   GI/Hepatic negative GI ROS, Neg liver ROS,   Endo/Other  negative endocrine ROS  Renal/GU negative Renal ROS  negative genitourinary   Musculoskeletal   Abdominal (+) + obese,   Peds  Hematology  (+) Blood dyscrasia, anemia ,   Anesthesia Other Findings   Reproductive/Obstetrics (+) Pregnancy                             Anesthesia Physical Anesthesia Plan  ASA: II  Anesthesia Plan: Epidural   Post-op Pain Management:    Induction:   PONV Risk Score and Plan:   Airway Management Planned:   Additional Equipment:   Intra-op Plan:   Post-operative Plan:   Informed Consent: I have reviewed the patients History and Physical, chart, labs and discussed the procedure including the risks, benefits and alternatives for the proposed anesthesia with the patient or authorized representative who has indicated his/her understanding and acceptance.       Plan Discussed with:   Anesthesia Plan Comments:         Anesthesia Quick Evaluation

## 2019-04-12 ENCOUNTER — Other Ambulatory Visit: Payer: Self-pay

## 2019-04-12 ENCOUNTER — Inpatient Hospital Stay (HOSPITAL_COMMUNITY): Payer: Medicaid Other | Admitting: Anesthesiology

## 2019-04-12 ENCOUNTER — Encounter (HOSPITAL_COMMUNITY)
Admission: AD | Disposition: A | Discharge status: Home / Self Care | Payer: Self-pay | Source: Home / Self Care | Attending: Obstetrics and Gynecology

## 2019-04-12 ENCOUNTER — Encounter (HOSPITAL_COMMUNITY): Payer: Self-pay | Admitting: Anesthesiology

## 2019-04-12 DIAGNOSIS — Z3490 Encounter for supervision of normal pregnancy, unspecified, unspecified trimester: Secondary | ICD-10-CM

## 2019-04-12 HISTORY — PX: DILATION AND EVACUATION: SHX1459

## 2019-04-12 LAB — CBC WITH DIFFERENTIAL/PLATELET
Abs Immature Granulocytes: 0.23 10*3/uL — ABNORMAL HIGH (ref 0.00–0.07)
Basophils Absolute: 0 10*3/uL (ref 0.0–0.1)
Basophils Relative: 0 %
Eosinophils Absolute: 0.1 10*3/uL (ref 0.0–0.5)
Eosinophils Relative: 0 %
HCT: 31.3 % — ABNORMAL LOW (ref 36.0–46.0)
Hemoglobin: 11.5 g/dL — ABNORMAL LOW (ref 12.0–15.0)
Immature Granulocytes: 1 %
Lymphocytes Relative: 7 %
Lymphs Abs: 1.8 10*3/uL (ref 0.7–4.0)
MCH: 33.7 pg (ref 26.0–34.0)
MCHC: 36.7 g/dL — ABNORMAL HIGH (ref 30.0–36.0)
MCV: 91.8 fL (ref 80.0–100.0)
Monocytes Absolute: 1.9 10*3/uL — ABNORMAL HIGH (ref 0.1–1.0)
Monocytes Relative: 7 %
Neutro Abs: 21.5 10*3/uL — ABNORMAL HIGH (ref 1.7–7.7)
Neutrophils Relative %: 85 %
Platelets: 254 10*3/uL (ref 150–400)
RBC: 3.41 MIL/uL — ABNORMAL LOW (ref 3.87–5.11)
RDW: 12 % (ref 11.5–15.5)
WBC: 25.6 10*3/uL — ABNORMAL HIGH (ref 4.0–10.5)
nRBC: 0 % (ref 0.0–0.2)

## 2019-04-12 LAB — CBC
HCT: 28.3 % — ABNORMAL LOW (ref 36.0–46.0)
HCT: 24.5 % — ABNORMAL LOW (ref 36.0–46.0)
Hemoglobin: 9.8 g/dL — ABNORMAL LOW (ref 12.0–15.0)
Hemoglobin: 8.7 g/dL — ABNORMAL LOW (ref 12.0–15.0)
MCH: 32.2 pg (ref 26.0–34.0)
MCH: 33.2 pg (ref 26.0–34.0)
MCHC: 34.6 g/dL (ref 30.0–36.0)
MCHC: 35.5 g/dL (ref 30.0–36.0)
MCV: 93.1 fL (ref 80.0–100.0)
MCV: 93.5 fL (ref 80.0–100.0)
Platelets: 259 10*3/uL (ref 150–400)
Platelets: 253 10*3/uL (ref 150–400)
RBC: 3.04 MIL/uL — ABNORMAL LOW (ref 3.87–5.11)
RBC: 2.62 MIL/uL — ABNORMAL LOW (ref 3.87–5.11)
RDW: 12.3 % (ref 11.5–15.5)
RDW: 12.4 % (ref 11.5–15.5)
WBC: 19.3 10*3/uL — ABNORMAL HIGH (ref 4.0–10.5)
WBC: 21.7 10*3/uL — ABNORMAL HIGH (ref 4.0–10.5)
nRBC: 0 % (ref 0.0–0.2)
nRBC: 0 % (ref 0.0–0.2)

## 2019-04-12 LAB — FIBRINOGEN: Fibrinogen: 546 mg/dL — ABNORMAL HIGH (ref 210–475)

## 2019-04-12 LAB — PROTIME-INR
INR: 1.1 (ref 0.8–1.2)
Prothrombin Time: 14.4 seconds (ref 11.4–15.2)

## 2019-04-12 LAB — APTT: aPTT: 34 seconds (ref 24–36)

## 2019-04-12 LAB — PREPARE RBC (CROSSMATCH)

## 2019-04-12 SURGERY — DILATION AND EVACUATION, UTERUS
Anesthesia: Spinal | Site: Vagina | Wound class: Clean Contaminated

## 2019-04-12 MED ORDER — PHENYLEPHRINE 40 MCG/ML (10ML) SYRINGE FOR IV PUSH (FOR BLOOD PRESSURE SUPPORT)
PREFILLED_SYRINGE | INTRAVENOUS | Status: AC
  Filled 2019-04-12: qty 10

## 2019-04-12 MED ORDER — FENTANYL CITRATE (PF) 100 MCG/2ML IJ SOLN
INTRAMUSCULAR | Status: AC
  Filled 2019-04-12: qty 2

## 2019-04-12 MED ORDER — BUPIVACAINE IN DEXTROSE 0.75-8.25 % IT SOLN
INTRATHECAL | Status: DC | PRN
  Administered 2019-04-12: 1.4 mL via INTRATHECAL

## 2019-04-12 MED ORDER — WITCH HAZEL-GLYCERIN EX PADS: 1.0000 "application " | MEDICATED_PAD | CUTANEOUS | Status: DC | PRN

## 2019-04-12 MED ORDER — ONDANSETRON HCL 4 MG/2ML IJ SOLN
INTRAMUSCULAR | Status: DC | PRN
  Administered 2019-04-12: 4 mg via INTRAVENOUS

## 2019-04-12 MED ORDER — OXYTOCIN 10 UNIT/ML IJ SOLN
INTRAMUSCULAR | Status: DC | PRN
  Administered 2019-04-12: 40 [IU] via INTRAMUSCULAR

## 2019-04-12 MED ORDER — SENNOSIDES-DOCUSATE SODIUM 8.6-50 MG PO TABS
2.0000 | ORAL_TABLET | ORAL | Status: DC
  Administered 2019-04-13 – 2019-04-14 (×2): 2 via ORAL
  Filled 2019-04-12 (×2): qty 2

## 2019-04-12 MED ORDER — SOD CITRATE-CITRIC ACID 500-334 MG/5ML PO SOLN
ORAL | Status: AC
  Filled 2019-04-12: qty 15

## 2019-04-12 MED ORDER — MIDAZOLAM HCL 2 MG/2ML IJ SOLN
INTRAMUSCULAR | Status: DC | PRN
  Administered 2019-04-12 (×2): 0.5 mg via INTRAVENOUS

## 2019-04-12 MED ORDER — CEFAZOLIN SODIUM-DEXTROSE 2-3 GM-%(50ML) IV SOLR
INTRAVENOUS | Status: DC | PRN
  Administered 2019-04-12: 2 g via INTRAVENOUS

## 2019-04-12 MED ORDER — FENTANYL CITRATE (PF) 100 MCG/2ML IJ SOLN
INTRAMUSCULAR | Status: DC | PRN
  Administered 2019-04-12 (×2): 25 ug via INTRAVENOUS

## 2019-04-12 MED ORDER — OXYCODONE-ACETAMINOPHEN 5-325 MG PO TABS
1.0000 | ORAL_TABLET | ORAL | Status: DC | PRN
  Administered 2019-04-12: 1 via ORAL
  Administered 2019-04-13: 2 via ORAL
  Administered 2019-04-13: 1 via ORAL
  Administered 2019-04-13: 2 via ORAL
  Filled 2019-04-12 (×2): qty 1
  Filled 2019-04-12 (×2): qty 2

## 2019-04-12 MED ORDER — MIDAZOLAM HCL 2 MG/2ML IJ SOLN
INTRAMUSCULAR | Status: AC
  Filled 2019-04-12: qty 2

## 2019-04-12 MED ORDER — LACTATED RINGERS IV SOLN
INTRAVENOUS | Status: DC
  Administered 2019-04-12 – 2019-04-13 (×3): via INTRAVENOUS

## 2019-04-12 MED ORDER — SODIUM CHLORIDE 0.9 % IR SOLN
Status: DC | PRN
  Administered 2019-04-11: 1

## 2019-04-12 MED ORDER — METHYLERGONOVINE MALEATE 0.2 MG PO TABS
0.2000 mg | ORAL_TABLET | Freq: Four times a day (QID) | ORAL | Status: DC
  Administered 2019-04-13 (×2): 0.2 mg via ORAL
  Filled 2019-04-12 (×2): qty 1

## 2019-04-12 MED ORDER — SODIUM CHLORIDE 0.9 % IV SOLN
INTRAVENOUS | Status: DC | PRN
  Administered 2019-04-12: 18:00:00 via INTRAVENOUS

## 2019-04-12 MED ORDER — LACTATED RINGERS IV SOLN
INTRAVENOUS | Status: DC | PRN
  Administered 2019-04-12: 18:00:00 via INTRAVENOUS

## 2019-04-12 MED ORDER — MEASLES, MUMPS & RUBELLA VAC IJ SOLR: 0.5000 mL | Freq: Once | INTRAMUSCULAR | Status: DC

## 2019-04-12 MED ORDER — OXYTOCIN 40 UNITS IN NORMAL SALINE INFUSION - SIMPLE MED
INTRAVENOUS | Status: AC
  Filled 2019-04-12: qty 1000

## 2019-04-12 MED ORDER — SOD CITRATE-CITRIC ACID 500-334 MG/5ML PO SOLN: 30.0000 mL | Freq: Once | ORAL | Status: DC

## 2019-04-12 MED ORDER — SIMETHICONE 80 MG PO CHEW
80.0000 mg | CHEWABLE_TABLET | ORAL | Status: DC
  Administered 2019-04-13 – 2019-04-14 (×2): 80 mg via ORAL
  Filled 2019-04-12 (×2): qty 1

## 2019-04-12 MED ORDER — KETOROLAC TROMETHAMINE 30 MG/ML IJ SOLN
INTRAMUSCULAR | Status: AC
  Filled 2019-04-12: qty 1

## 2019-04-12 MED ORDER — HYDROMORPHONE HCL 1 MG/ML IJ SOLN
INTRAMUSCULAR | Status: AC
  Filled 2019-04-12: qty 0.5

## 2019-04-12 MED ORDER — CEFAZOLIN SODIUM-DEXTROSE 2-4 GM/100ML-% IV SOLN
INTRAVENOUS | Status: AC
  Filled 2019-04-12: qty 100

## 2019-04-12 MED ORDER — DEXAMETHASONE SODIUM PHOSPHATE 4 MG/ML IJ SOLN
INTRAMUSCULAR | Status: DC | PRN
  Administered 2019-04-12: 4 mg via INTRAVENOUS

## 2019-04-12 MED ORDER — PHENYLEPHRINE HCL-NACL 20-0.9 MG/250ML-% IV SOLN
INTRAVENOUS | Status: AC
  Filled 2019-04-12: qty 250

## 2019-04-12 MED ORDER — TETANUS-DIPHTH-ACELL PERTUSSIS 5-2.5-18.5 LF-MCG/0.5 IM SUSP: 0.5000 mL | Freq: Once | INTRAMUSCULAR | Status: DC

## 2019-04-12 MED ORDER — ACETAMINOPHEN 10 MG/ML IV SOLN
INTRAVENOUS | Status: AC
  Filled 2019-04-12: qty 100

## 2019-04-12 MED ORDER — ZOLPIDEM TARTRATE 5 MG PO TABS: 5.0000 mg | ORAL_TABLET | Freq: Every evening | ORAL | Status: DC | PRN

## 2019-04-12 MED ORDER — METHYLERGONOVINE MALEATE 0.2 MG/ML IJ SOLN
0.2000 mg | Freq: Once | INTRAMUSCULAR | Status: AC
  Administered 2019-04-12: 13:00:00 0.2 mg via INTRAMUSCULAR
  Filled 2019-04-12: qty 1

## 2019-04-12 MED ORDER — COCONUT OIL OIL: 1.0000 "application " | TOPICAL_OIL | Status: DC | PRN

## 2019-04-12 MED ORDER — STERILE WATER FOR IRRIGATION IR SOLN
Status: DC | PRN
  Administered 2019-04-12: 1000 mL

## 2019-04-12 MED ORDER — STERILE WATER FOR IRRIGATION IR SOLN
Status: DC | PRN
  Administered 2019-04-11: 1

## 2019-04-12 MED ORDER — DIBUCAINE (PERIANAL) 1 % EX OINT: 1.0000 "application " | TOPICAL_OINTMENT | CUTANEOUS | Status: DC | PRN

## 2019-04-12 MED ORDER — DEXAMETHASONE SODIUM PHOSPHATE 4 MG/ML IJ SOLN
INTRAMUSCULAR | Status: AC
  Filled 2019-04-12: qty 7

## 2019-04-12 MED ORDER — METOCLOPRAMIDE HCL 5 MG/ML IJ SOLN
INTRAMUSCULAR | Status: AC
  Filled 2019-04-12: qty 2

## 2019-04-12 MED ORDER — OXYTOCIN 40 UNITS IN NORMAL SALINE INFUSION - SIMPLE MED: 2.5000 [IU]/h | INTRAVENOUS | Status: AC

## 2019-04-12 SURGICAL SUPPLY — 16 items
ADAPTER VACURETTE TBG SET 14 (CANNULA) ×3 IMPLANT
CATH ROBINSON RED A/P 16FR (CATHETERS) ×3 IMPLANT
CLOTH BEACON ORANGE TIMEOUT ST (SAFETY) ×3 IMPLANT
DECANTER SPIKE VIAL GLASS SM (MISCELLANEOUS) ×3 IMPLANT
GLOVE BIOGEL PI IND STRL 7.0 (GLOVE) ×1 IMPLANT
GLOVE BIOGEL PI INDICATOR 7.0 (GLOVE) ×2
GLOVE ECLIPSE 7.0 STRL STRAW (GLOVE) ×6 IMPLANT
GOWN STRL REUS W/TWL LRG LVL3 (GOWN DISPOSABLE) ×9 IMPLANT
KIT BERKELEY 1ST TRIMESTER 3/8 (MISCELLANEOUS) ×3 IMPLANT
NS IRRIG 1000ML POUR BTL (IV SOLUTION) ×3 IMPLANT
PACK VAGINAL MINOR WOMEN LF (CUSTOM PROCEDURE TRAY) ×3 IMPLANT
PAD OB MATERNITY 4.3X12.25 (PERSONAL CARE ITEMS) ×3 IMPLANT
PAD PREP 24X48 CUFFED NSTRL (MISCELLANEOUS) ×3 IMPLANT
SET BERKELEY SUCTION TUBING (SUCTIONS) ×3 IMPLANT
TOWEL OR 17X24 6PK STRL BLUE (TOWEL DISPOSABLE) ×6 IMPLANT
VACURETTE 14MM CVD 1/2 BASE (CANNULA) ×3 IMPLANT

## 2019-04-12 NOTE — Lactation Note (Signed)
This note was copied from a baby's chart. Lactation Consultation Note  Patient Name: Emily [name] [id number] Date: 04/12/2019 Reason for consult: Follow-up assessment;Infant < 6lbs;Early term 37-38.6wks;Primapara;1st time breastfeeding;Other (Comment)(IUGR)  28 hours old ETI female < 6 lbs who is being mostly formula fed at this point, mom is on Mag and not feeling well, she hasn't started pumping either. Mom is a P1 and reported minimal breast changes during the pregnancy, however when revising hand expression colostrum was just pouring out of her left breast, she was also able to do teach back, praised her for her efforts. She has a Medela DEBP at home, she left in the car.  Offered assistance with latch but mom declined, RN Vania Rea also told [place] that baby is still being very spitty, she had an emesis during [facility] consultation. Mom complaining about pain and blood clot, her RN Maudie Mercury suggested to start pumping today, LC set up a DEBP, kit had already being brought to the room, reviewed instructions, cleaning and storage with dad as well as milk storage guidelines.   Parents aware they need to feed baby every 3 hours, reviewed normal newborn behavior and feeding cues, advised parents to watch for volume of feeds since baby is spitty. Mom has only put baby to breast twice but plans on doing it more often once she feels better,  Feeding plan:  1. Encouraged mom to start pumping today, ideally every 3 hours, but she'll got at her own pace since she's still on Mag 2. Mom will try putting baby to breast on cues, if baby is not latching, she'll pump instead  BF brochure and LPI handout were also reviewed. Parents reported all questions and concerns were answered, they're both aware of [place] OP services and will call PRN.  Maternal Data Formula Feeding for Exclusion: No Has patient been taught Hand Expression?: Yes Does the patient have breastfeeding experience prior to this delivery?: No  Feeding     LATCH Score                   Interventions Interventions: Breast feeding basics reviewed;Hand express;Breast massage;Breast compression;DEBP  Lactation Tools Discussed/Used Tools: Pump Breast pump type: Double-Electric Breast Pump WIC Program: No Pump Review: Setup, frequency, and cleaning;Milk Storage Initiated by:: MPeck Date initiated:: 04/12/19   Consult Status Consult Status: Follow-up Date: 04/13/19 Follow-up type: In-patient    Emily [name] 04/12/2019, 2:59 PM

## 2019-04-12 NOTE — Progress Notes (Signed)
Expelled lemon size clot with manual message.  Bleeding small amount.

## 2019-04-12 NOTE — Progress Notes (Signed)
Pt has passed one more clot. Her hgb has changed very little. The coag studies are wnl Given the likelihood that she will continue to pass clots unless the uterus is evacuated I have decided to proceed with D&E.Discussed with pt and spouse

## 2019-04-12 NOTE — Anesthesia Preprocedure Evaluation (Signed)
Anesthesia Evaluation  Patient identified by MRN, date of birth, ID band Patient awake    Reviewed: Allergy & Precautions, NPO status , Patient's Chart, lab work & pertinent test results  Airway Mallampati: II  TM Distance: >3 FB Neck ROM: Full    Dental no notable dental hx. (+) Teeth Intact   Pulmonary neg pulmonary ROS, Current Smoker,    Pulmonary exam normal breath sounds clear to auscultation       Cardiovascular hypertension, Normal cardiovascular exam Rhythm:Regular Rate:Normal     Neuro/Psych negative neurological ROS  negative psych ROS   GI/Hepatic Neg liver ROS, GERD  Medicated and Controlled,  Endo/Other  Obesity  Renal/GU negative Renal ROS  negative genitourinary   Musculoskeletal negative musculoskeletal ROS (+)   Abdominal (+) + obese,   Peds  Hematology  (+) anemia ,   Anesthesia Other Findings   Reproductive/Obstetrics Retained blood clots-uterus Pre eclampsia- on MgSO4                              Anesthesia Physical Anesthesia Plan  ASA: II and emergent  Anesthesia Plan: Spinal   Post-op Pain Management:    Induction:   PONV Risk Score and Plan: Propofol infusion, Treatment may vary due to age or medical condition and Ondansetron  Airway Management Planned: Natural Airway and Nasal Cannula  Additional Equipment:   Intra-op Plan:   Post-operative Plan:   Informed Consent: I have reviewed the patients History and Physical, chart, labs and discussed the procedure including the risks, benefits and alternatives for the proposed anesthesia with the patient or authorized representative who has indicated his/her understanding and acceptance.     Dental advisory given  Plan Discussed with: Anesthesiologist, CRNA and Surgeon  Anesthesia Plan Comments:         Anesthesia Quick Evaluation

## 2019-04-12 NOTE — Anesthesia Postprocedure Evaluation (Signed)
Anesthesia Post Note  Patient: Emily [name]  Procedure(s) Performed: CESAREAN SECTION (N/A )     Patient location during evaluation: PACU Anesthesia Type: Epidural Level of consciousness: awake and alert Pain management: pain level controlled Vital Signs Assessment: post-procedure vital signs reviewed and stable Respiratory status: spontaneous breathing, nonlabored ventilation and respiratory function stable Cardiovascular status: stable Postop Assessment: no headache, no backache and epidural receding Anesthetic complications: no    Last Vitals:  Vitals:   04/12/19 0319 04/12/19 0407  BP: (!) 145/83   Pulse: 75   Resp: 17 16  Temp: 36.8 C   SpO2: 96%     Last Pain:  Vitals:   04/12/19 0319  TempSrc: Oral  PainSc:    Pain Goal: Patients Stated Pain Goal: 3 (04/12/19 0230)                 Thurmond Butts P Chermaine Schnyder

## 2019-04-12 NOTE — Progress Notes (Signed)
Called by nursing service because pt passed a large clot, then another. On exam the uterine fundus was 7cm above the umbilicus , now at umbilicus after clots removed and massage. During the surgery the uterus was examined 3 x for POC. There was some uterine atony and methergine was going to be used however tone improved and it was not. She recently had methergine IM and is not bleeding. On my exam there are no clots in the vagina The cx is 3 cm and I can palpate some clots in the uterus.   IMP/ Passage of clots and enlarged uterus secondary to atony. Not sure when this began. Retained POC is very unlikely given the rigorous inspection at time of surgery  Plan/ Will add pitocin to the IVFs.           Will continue methergine           Will stop Mg SO4 as this may add to the atony and she has nl B/P good output, No symptoms

## 2019-04-12 NOTE — Progress Notes (Signed)
This RN asked to assess patient vaginal bleeding.  Found uterus to be above umbilicus (4-6 above umbilicus).  Grapefruit size clot expressed from uterus with manual message with moderate bleeding.  Dr. Ouida Sills notified and orders received.  Will continue to monitor.

## 2019-04-12 NOTE — Transfer of Care (Signed)
Immediate Anesthesia Transfer of Care Note  Patient: Emily [name]  Procedure(s) Performed: DILATATION AND EVACUATION (N/A Vagina )  Patient Location: PACU  Anesthesia Type:Spinal  Level of Consciousness: awake, alert  and oriented  Airway & Oxygen Therapy: Patient Spontanous Breathing  Post-op Assessment: Report given to RN and Post -op Vital signs reviewed and stable  Post vital signs: Reviewed and stable  Last Vitals:  Vitals Value Taken Time  BP    Temp    Pulse 86 04/12/19 1823  Resp 13 04/12/19 1823  SpO2 98 % 04/12/19 1823  Vitals shown include unvalidated device data.  Last Pain:  Vitals:   04/12/19 1146  TempSrc:   PainSc: 7       Patients Stated Pain Goal: 3 (61/60/73 7106)  Complications: No apparent anesthesia complications

## 2019-04-12 NOTE — Lactation Note (Signed)
This note was copied from a baby's chart. Lactation Consultation Note Baby 4 hrs old. Mom very sleepy, on Mag. Baby has been on the breast and supplemented per RN. Gave mom LPI information sheet. Asked mom to ready today. Reviewed feeding, supplementing, and pumping. Not feed longer than 30 min. Total at feedings. Mom asked if someone will come by later today, LC agreed someone will f/u with her.  Lactation brochure given.  Patient Name: Emily [name] [id number] Date: 04/12/2019 Reason for consult: Initial assessment;Infant < 6lbs;Early term 37-38.6wks;Primapara   Maternal Data Does the patient have breastfeeding experience prior to this delivery?: No  Feeding Feeding Type: Breast Fed  LATCH Score Latch: Repeated attempts needed to sustain latch, nipple held in mouth throughout feeding, stimulation needed to elicit sucking reflex.  Audible Swallowing: A few with stimulation  Type of Nipple: Everted at rest and after stimulation  Comfort (Breast/Nipple): Soft / non-tender  Hold (Positioning): Full assist, staff holds infant at breast  LATCH Score: 6  Interventions    Lactation Tools Discussed/Used     Consult Status Consult Status: Follow-up Date: 04/12/19 Follow-up type: In-patient    Diontay Rosencrans, Elta Gu[place] 04/12/2019, 4:18 AM

## 2019-04-12 NOTE — Progress Notes (Signed)
Dr. Anderson at bedside.

## 2019-04-12 NOTE — Progress Notes (Signed)
POD#1/2 Pt c/o being tired. Nl Lochia, Passed on clot this am B/P wnl Abd- soft IMP/ Stable on Mag Plan/ Follow Is/Os

## 2019-04-12 NOTE — Transfer of Care (Signed)
Immediate Anesthesia Transfer of Care Note  Patient: Emily [name]  Procedure(s) Performed: CESAREAN SECTION (N/A )  Patient Location: PACU  Anesthesia Type:Epidural  Level of Consciousness: awake  Airway & Oxygen Therapy: Patient Spontanous Breathing  Post-op Assessment: Report given to RN and Post -op Vital signs reviewed and stable  Post vital signs: Reviewed and stable  Last Vitals:  Vitals Value Taken Time  BP 114/76 04/12/19 0000  Temp    Pulse 104 04/12/19 0001  Resp 13 04/12/19 0001  SpO2 97 % 04/12/19 0001  Vitals shown include unvalidated device data.  Last Pain:  Vitals:   04/11/19 2132  TempSrc: Oral  PainSc: 2          Complications: No apparent anesthesia complications

## 2019-04-12 NOTE — Anesthesia Procedure Notes (Signed)
Spinal  Patient location during procedure: OR Start time: 04/12/2019 5:38 PM End time: 04/12/2019 5:42 PM Staffing Anesthesiologist: Josephine Igo, MD Performed: anesthesiologist  Preanesthetic Checklist Completed: patient identified, site marked, surgical consent, pre-op evaluation, timeout performed, IV checked, risks and benefits discussed and monitors and equipment checked Spinal Block Patient position: sitting Prep: site prepped and draped and DuraPrep Patient monitoring: heart rate, cardiac monitor, continuous pulse ox and blood pressure Approach: midline Location: L3-4 Injection technique: single-shot Needle Needle type: Pencan  Needle gauge: 24 G Needle length: 9 cm Needle insertion depth: 7 cm Assessment Sensory level: T4 Additional Notes Patient tolerated procedure well. Adequate sensory level.

## 2019-04-12 NOTE — Anesthesia Postprocedure Evaluation (Signed)
Anesthesia Post Note  Patient: Emily [name]  Procedure(s) Performed: DILATATION AND EVACUATION (N/A Vagina )     Patient location during evaluation: PACU Anesthesia Type: Spinal Level of consciousness: oriented and awake and alert Pain management: pain level controlled Vital Signs Assessment: post-procedure vital signs reviewed and stable Respiratory status: spontaneous breathing, respiratory function stable and nonlabored ventilation Cardiovascular status: blood pressure returned to baseline and stable Postop Assessment: no headache, no backache, no apparent nausea or vomiting, spinal receding and patient able to bend at knees Anesthetic complications: no    Last Vitals:  Vitals:   04/12/19 1900 04/12/19 1915  BP: (!) 139/93 136/87  Pulse: 82 60  Resp: 17 13  Temp:    SpO2: 98% 96%    Last Pain:  Vitals:   04/12/19 1830  TempSrc: Oral  PainSc:    Pain Goal: Patients Stated Pain Goal: 3 (04/12/19 1146)  LLE Motor Response: Purposeful movement (04/12/19 1915) LLE Sensation: Tingling (04/12/19 1915) RLE Motor Response: Purposeful movement (04/12/19 1915) RLE Sensation: Tingling (04/12/19 1915)     Epidural/Spinal Function Cutaneous sensation: Able to Wiggle Toes (04/12/19 1915), Patient able to flex knees: Yes (04/12/19 1915), Patient able to lift hips off bed: Yes (04/12/19 1915), Back pain beyond tenderness at insertion site: No (04/12/19 1915), Progressively worsening motor and/or sensory loss: No (04/12/19 1915), Bowel and/or bladder incontinence post epidural: No (04/12/19 1915)  Zaydyn Havey A.

## 2019-04-12 NOTE — Progress Notes (Signed)
Continued manual message with moderate gushes of blood without any further clots.

## 2019-04-13 ENCOUNTER — Encounter (HOSPITAL_COMMUNITY): Payer: Self-pay | Admitting: Obstetrics and Gynecology

## 2019-04-13 LAB — CBC
HCT: 18.4 % — ABNORMAL LOW (ref 36.0–46.0)
Hemoglobin: 6.2 g/dL — CL (ref 12.0–15.0)
MCH: 32.8 pg (ref 26.0–34.0)
MCHC: 33.7 g/dL (ref 30.0–36.0)
MCV: 97.4 fL (ref 80.0–100.0)
Platelets: 251 10*3/uL (ref 150–400)
RBC: 1.89 MIL/uL — ABNORMAL LOW (ref 3.87–5.11)
RDW: 12.6 % (ref 11.5–15.5)
WBC: 16.7 10*3/uL — ABNORMAL HIGH (ref 4.0–10.5)
nRBC: 0 % (ref 0.0–0.2)

## 2019-04-13 MED ORDER — NIFEDIPINE ER OSMOTIC RELEASE 30 MG PO TB24
30.0000 mg | ORAL_TABLET | Freq: Every day | ORAL | Status: DC
  Administered 2019-04-13 – 2019-04-14 (×2): 30 mg via ORAL
  Filled 2019-04-13 (×2): qty 1

## 2019-04-13 MED ORDER — POLYETHYLENE GLYCOL 3350 17 G PO PACK
17.0000 g | PACK | Freq: Every day | ORAL | Status: DC
  Administered 2019-04-13 – 2019-04-14 (×2): 17 g via ORAL
  Filled 2019-04-13 (×2): qty 1

## 2019-04-13 MED ORDER — ACETAMINOPHEN 325 MG PO TABS
650.0000 mg | ORAL_TABLET | Freq: Four times a day (QID) | ORAL | Status: DC
  Administered 2019-04-13 – 2019-04-14 (×5): 650 mg via ORAL
  Filled 2019-04-13 (×5): qty 2

## 2019-04-13 MED ORDER — FERROUS SULFATE 325 (65 FE) MG PO TABS
325.0000 mg | ORAL_TABLET | Freq: Every day | ORAL | Status: DC
  Administered 2019-04-13 – 2019-04-14 (×2): 325 mg via ORAL
  Filled 2019-04-13 (×2): qty 1

## 2019-04-13 MED ORDER — IBUPROFEN 600 MG PO TABS
600.0000 mg | ORAL_TABLET | Freq: Four times a day (QID) | ORAL | Status: DC
  Administered 2019-04-13 – 2019-04-14 (×6): 600 mg via ORAL
  Filled 2019-04-13 (×6): qty 1

## 2019-04-13 MED ORDER — OXYCODONE HCL 5 MG PO TABS
5.0000 mg | ORAL_TABLET | ORAL | Status: DC | PRN
  Administered 2019-04-13: 10 mg via ORAL
  Administered 2019-04-14: 5 mg via ORAL
  Filled 2019-04-13: qty 2
  Filled 2019-04-13: qty 1

## 2019-04-13 NOTE — Lactation Note (Signed)
This note was copied from a baby's chart. Lactation Consultation Note  Patient Name: Emily [name] NOMVE'H Date: 04/13/2019 Reason for consult: Follow-up assessment;Early term 37-38.6wks;Primapara;1st time breastfeeding  P1 mother whose infant is now 3 hours old.  Mother has been breast/bottle feeding but states that her goal is to breast feed.  Baby was asleep in the bassinet when I arrived.  Mother admitted to feeling badly yesterday and not real willing to practice breast feeding.  I acknowledged her distress.  Today she is feeling much better.  Encouraged to feed with feeding cues and to always latch baby to the breast prior to giving any supplement.  Mother did not have the feeding guideline supplementation sheet at bedside so I provided this with examples of how much to feed.  Suggested mother continue hand expression and to save any EBM she obtains to feed back to baby.  Colostrum container provided and milk storage times reviewed.  Finger feeding demonstrated.  Mother stated her nipples are everted and baby latches appropriately.  I did not observe her breasts at this time.  Suggested mother call for latch assistance as needed.    She has a DEBP for home use.  A manual pump will be provided at discharge.  Mother will call as needed.   Maternal Data Formula Feeding for Exclusion: No Has patient been taught Hand Expression?: Yes Does the patient have breastfeeding experience prior to this delivery?: No  Feeding Feeding Type: Bottle Fed - Formula Nipple Type: Slow - flow  LATCH Score                   Interventions    Lactation Tools Discussed/Used     Consult Status Consult Status: Follow-up Date: 04/14/19 Follow-up type: In-patient    [name] 04/13/2019, 3:42 PM

## 2019-04-13 NOTE — Op Note (Signed)
[name], [name] MEDICAL RECORD [id number] ACCOUNT [id number] DATE OF BIRTH:September 02, 1990 FACILITY: [facility] LOCATION: [place], [place]  OPERATIVE REPORT  DATE OF PROCEDURE:  04/11/2019  PREOPERATIVE DIAGNOSES:   1.  Intrauterine pregnancy at 63 weeks' estimated gestational age. 2.  Failure to progress. 3.  Severe preeclampsia.  POSTOPERATIVE DIAGNOSES: 1.  Intrauterine pregnancy at 74 weeks' estimated gestational age. 2.  Failure to progress. 3.  Severe preeclampsia.  PROCEDURE PERFORMED:  Primary low transverse cesarean section.  SURGEON:  Freda Munro, MD  ANESTHESIA:  Epidural.  ANTIBIOTICS:  Ancef 2 grams.  DRAINS:  Foley to bedside drainage.  ESTIMATED BLOOD LOSS:  900 mL.  SPECIMENS:  Placenta sent to pathology.  COMPLICATIONS:  None.  DESCRIPTION OF PROCEDURE:  The patient was taken to the operating room where her epidural anesthetic was reinjected.  Once an adequate level was reached, she was prepped and draped in the usual fashion for this procedure.  A Pfannenstiel incision was  then made on entering the abdominal cavity, the bladder flap was taken down with sharp dissection.  A lower uterine transverse incision was made in the midline and extended laterally with blunt dissection.  Amniotic fluid was noted to be clear.  The  infant was delivered in the vertex presentation.  On delivery of the head, the oropharynx and nostrils were bulb suctioned.  The remaining infant was then delivered.  The cord was doubly clamped and cut and the infant handed to the awaiting NICU team.   The placenta was then manually removed.  The uterine cavity was examined 3 times and no evidence of any products of conception were noted.  At this point, the uterine cavity was closed using 0 Monocryl suture in a single layer in a running locking  fashion.  The bladder flap was closed using 2-0 Monocryl in a running fashion.  There was some uterine atony, which improved.   She was going to be given Methergine however, in light of her hypertension and improvement in the atony, this was not given.   At this point, the parietal peritoneum and rectus muscles were reapproximated in the midline with 2-0 Monocryl in a running fashion.  The fascia was closed using 0 Monocryl suture in a running fashion.  Subcuticular tissue was closed with 2-0 plain gut  and the skin with 4-0 Vicryl in a subcuticular fashion.  The patient tolerated the procedure well.  She was taken to recovery room in stable condition.  Instrument and lap counts correct x3.  JN/NUANCE  D:04/12/2019 T:04/13/2019 JOB:[id number]

## 2019-04-13 NOTE — Progress Notes (Signed)
Patient is doing well.  She is tolerating PO and ambulating.  Pain is controlled.  Since D&C for uterine atony / heavy bleeding yesterday evening, her bleeding has been minimal.  She has not passed any additional clots.  Urine output excellent and foley catheter has been removed.  She denies dizziness.     Vitals:   04/13/19 0033 04/13/19 0411 04/13/19 0415 04/13/19 0826  BP: 137/84  130/77 134/82  Pulse: 87  93 90  Resp: 18  18 18   Temp: 99.1 F (37.3 C)  98.4 F (36.9 C) 98.6 F (37 C)  TempSrc: Oral  Oral Oral  SpO2: 99% 97% 97% 99%  Weight:      Height:        NAD Abdomen:  soft, appropriate tenderness, incisions intact and without erythema or drainage ext:    Symmetric, + SCDs, trace edema bilaterally  Lab Results  Component Value Date   WBC 21.7 (H) 04/12/2019   HGB 8.7 (L) 04/12/2019   HCT 24.5 (L) 04/12/2019   MCV 93.5 04/12/2019   PLT 253 04/12/2019    --/--/B POS (09/18 0811)/RImmune  A/P    28 y.o. G1P0 POD 2 s/p primary c/s for failed induction of labor / POD#1 s/p D&C for PPH Uterine atony / PPH--bleeding is scant, VS normal, urine output excellent. Will monitor closely.  Received 2 doses of methergine following d/c.  Will hold future doses.   Will repeat CBC now BPs have been mild range since delivery 120-130/70-90s.  Will monitor closely and initiate anti-hypertensive therapy as needed Routine post op and postpartum care.

## 2019-04-13 NOTE — Progress Notes (Signed)
Vital signs reviewed.     Vitals:   04/13/19 0415 04/13/19 0826 04/13/19 1224 04/13/19 1517  BP: 130/77 134/82 (!) 159/90 (!) 151/91  Pulse: 93 90 95 100  Temp: 98.4 F (36.9 C) 98.6 F (37 C) 99 F (37.2 C) 99.3 F (37.4 C)  Resp: 18 18 18 20   Height:      Weight:      SpO2: 97% 99% 99% 100%  TempSrc: Oral Oral Oral Oral  BMI (Calculated):       A/P:  BP trending up since MgSO4 discontinued.  Procardia XL 30mg  daily started now

## 2019-04-13 NOTE — Progress Notes (Signed)
CRITICAL VALUE ALERT  Critical Value:  hgb 6.2  Date & Time Notied:  04/13/19 1107  Provider Notified: Carlis Abbott  Orders Received/Actions taken: Oral Fe ordered

## 2019-04-13 NOTE — Progress Notes (Signed)
TC from RN.  Hemoglobin 6.2.   Patient this AM was asymptomatic--not feeling dizzy/lightheaded, ambulating, voiding, VS stable  BP 134/82   Pulse 90   Temp 98.6 F (37 C) (Oral)   Resp 18   Ht 5\' 9"  (1.753 m)   Wt 94.3 kg   LMP 07/25/2018 (Exact Date)   SpO2 99%   BMI 30.69 kg/m    ABLA related to [place].  Will start daily iron now with miralax given risk of constipation with narcotics.  Will increase to BID iron in 1-2 weeks when her narcotic requirement is decreased pot op

## 2019-04-13 NOTE — Plan of Care (Signed)
Problem: Education: Goal: Ability to make informed decisions regarding treatment and plan of care will improve Outcome: Progressing Goal: Ability to state and carry out methods to decrease the pain will improve Outcome: Progressing Goal: Individualized Educational Video(s) Outcome: Progressing   Problem: Coping: Goal: Ability to verbalize concerns and feelings about labor and delivery will improve Outcome: Progressing   Problem: Life Cycle: Goal: Ability to make normal progression through stages of labor will improve Outcome: Progressing Goal: Ability to effectively push during vaginal delivery will improve Outcome: Progressing   Problem: Role Relationship: Goal: Will demonstrate positive interactions with the child Outcome: Progressing   Problem: Safety: Goal: Risk of complications during labor and delivery will decrease Outcome: Progressing   Problem: Pain Management: Goal: Relief or control of pain from uterine contractions will improve Outcome: Progressing   Problem: Education: Goal: Knowledge of disease or condition will improve Outcome: Progressing Goal: Knowledge of the prescribed therapeutic regimen will improve Outcome: Progressing   Problem: Fluid Volume: Goal: Peripheral tissue perfusion will improve Outcome: Progressing   Problem: Clinical Measurements: Goal: Complications related to disease process, condition or treatment will be avoided or minimized Outcome: Progressing   Problem: Education: Goal: Knowledge of General Education information will improve Description: Including pain rating scale, medication(s)/side effects and non-pharmacologic comfort measures Outcome: Progressing   Problem: Health Behavior/Discharge Planning: Goal: Ability to manage health-related needs will improve Outcome: Progressing   Problem: Clinical Measurements: Goal: Ability to maintain clinical measurements within normal limits will improve Outcome: Progressing Goal:  Will remain free from infection Outcome: Progressing Goal: Diagnostic test results will improve Outcome: Progressing Goal: Respiratory complications will improve Outcome: Progressing Goal: Cardiovascular complication will be avoided Outcome: Progressing   Problem: Activity: Goal: Risk for activity intolerance will decrease Outcome: Progressing   Problem: Coping: Goal: Level of anxiety will decrease Outcome: Progressing   Problem: Elimination: Goal: Will not experience complications related to bowel motility Outcome: Progressing Goal: Will not experience complications related to urinary retention Outcome: Progressing   Problem: Safety: Goal: Ability to remain free from injury will improve Outcome: Progressing   Problem: Education: Goal: Knowledge of condition will improve Outcome: Progressing Goal: Individualized Educational Video(s) Outcome: Progressing Goal: Individualized Newborn Educational Video(s) Outcome: Progressing   Problem: Activity: Goal: Will verbalize the importance of balancing activity with adequate rest periods Outcome: Progressing Goal: Ability to tolerate increased activity will improve Outcome: Progressing   Problem: Coping: Goal: Ability to identify and utilize available resources and services will improve Outcome: Progressing   Problem: Life Cycle: Goal: Chance of risk for complications during the postpartum period will decrease Outcome: Progressing   Problem: Role Relationship: Goal: Ability to demonstrate positive interaction with newborn will improve Outcome: Progressing   Problem: Skin Integrity: Goal: Demonstration of wound healing without infection will improve Outcome: Progressing   Problem: Education: Goal: Knowledge of condition will improve Outcome: Progressing Goal: Individualized Educational Video(s) Outcome: Progressing Goal: Individualized Newborn Educational Video(s) Outcome: Progressing   Problem: Activity: Goal:  Will verbalize the importance of balancing activity with adequate rest periods Outcome: Progressing Goal: Ability to tolerate increased activity will improve Outcome: Progressing   Problem: Coping: Goal: Ability to identify and utilize available resources and services will improve Outcome: Progressing   Problem: Life Cycle: Goal: Chance of risk for complications during the postpartum period will decrease Outcome: Progressing   Problem: Role Relationship: Goal: Ability to demonstrate positive interaction with newborn will improve Outcome: Progressing   Problem: Skin Integrity: Goal: Demonstration of wound healing without infection will improve  Outcome: Progressing   

## 2019-04-13 NOTE — Addendum Note (Signed)
Addendum  created 04/13/19 1336 by Murvin Natal, MD   Intraprocedure Event edited

## 2019-04-14 ENCOUNTER — Encounter (HOSPITAL_COMMUNITY): Payer: Self-pay | Admitting: Obstetrics and Gynecology

## 2019-04-14 DIAGNOSIS — D62 Acute posthemorrhagic anemia: Secondary | ICD-10-CM

## 2019-04-14 LAB — CBC
HCT: 20.2 % — ABNORMAL LOW (ref 36.0–46.0)
Hemoglobin: 7.2 g/dL — ABNORMAL LOW (ref 12.0–15.0)
MCH: 34 pg (ref 26.0–34.0)
MCHC: 35.6 g/dL (ref 30.0–36.0)
MCV: 95.3 fL (ref 80.0–100.0)
Platelets: 379 10*3/uL (ref 150–400)
RBC: 2.12 MIL/uL — ABNORMAL LOW (ref 3.87–5.11)
RDW: 12.4 % (ref 11.5–15.5)
WBC: 16.9 10*3/uL — ABNORMAL HIGH (ref 4.0–10.5)
nRBC: 0 % (ref 0.0–0.2)

## 2019-04-14 LAB — TYPE AND SCREEN
ABO/RH(D): B POS
Antibody Screen: NEGATIVE
Unit division: 0
Unit division: 0

## 2019-04-14 LAB — BPAM RBC
Blood Product Expiration Date: 202009262359
Blood Product Expiration Date: 202009262359
Unit Type and Rh: 7300
Unit Type and Rh: 7300

## 2019-04-14 LAB — SURGICAL PATHOLOGY

## 2019-04-14 MED ORDER — FERROUS SULFATE 325 (65 FE) MG PO TABS: 325.0000 mg | ORAL_TABLET | Freq: Every day | ORAL | 11 refills | Status: AC

## 2019-04-14 MED ORDER — OXYCODONE HCL 5 MG PO TABS: 5.0000 mg | ORAL_TABLET | ORAL | 0 refills | Status: DC | PRN

## 2019-04-14 MED ORDER — NIFEDIPINE ER 30 MG PO TB24: 30.0000 mg | ORAL_TABLET | Freq: Every day | ORAL | 11 refills | Status: AC

## 2019-04-14 NOTE — Progress Notes (Addendum)
Subjective: Postpartum Day 3: Cesarean Delivery Postop Day 2: D&C Patient reports overall doing well, tolerating PO, pain controlled with meds, minimal bleeding. Ambulating well without lightheadedness or dizziness. Denies preeclampsia symptoms. Voiding spontaneously. Desires discharge today if possible  Objective: Vital signs in last 24 hours:  Patient Vitals for the past 24 hrs:  BP Temp Temp src Pulse Resp SpO2  04/14/19 0745 125/75 98.3 F (36.8 C) Oral 89 18 98 %  04/14/19 0315 139/75 98.5 F (36.9 C) Oral 96 17 -  04/13/19 2358 136/83 98.5 F (36.9 C) Oral 89 18 99 %  04/13/19 2058 (!) 149/89 99.1 F (37.3 C) Oral 94 18 99 %  04/13/19 1517 (!) 151/91 99.3 F (37.4 C) Oral 100 20 100 %  04/13/19 1224 (!) 159/90 99 F (37.2 C) Oral 95 18 99 %    Physical Exam:  General: alert and no distress Lochia: appropriate Uterine Fundus: firm Incision: dressing in place with previous area of saturation that has not spread, otherwise dry DVT Evaluation: No evidence of DVT seen on physical exam.  Recent Labs    04/12/19 1541 04/13/19 0921  HGB 8.7* 6.2*  HCT 24.5* 18.4*    Assessment/Plan: 28 y.o. Status post Cesarean section. Postoperative course complicated by postpartum hemorrhage required D&C, now asymptomatic and vital signs appropriate. Will monitor today and consider evening discharge  1. ABLA/PPH: sp D&C 2/2 atony, 2 doses methergine. 9/21 Hgb 6.2, asx and vss, deferred transfusion, on PO iron. Repeat CBC this AM and monitor s/sx of anemia 2. Severe preeclampsia: Initially on PP Mag, held due to [facility], BP increased 9/21, started procardia 30 xL (1st dose 3pm 9/21). BP now appropriate, cont to monitor, if remains wnl will plan for DC with home BP monitoring (already has cuff) and 1 week BP check.  3. Obese: BMI 30.69, ambulating well.   Continue current care.  Avika Carbine K Taam-Akelman 04/14/2019, 11:00 AM

## 2019-04-14 NOTE — Discharge Instructions (Signed)
Check your blood pressure, three times a day, if persistently 150/90 call our office.  Watch for severe features: persistent headache, vision changes, shortness of breath, chest pain, right upper quadrant pain, BP >160/110. Call our office or present to care if any of these severe features. Follow up in 1 week for a blood pressure check.   Remove dressing one week after delivery  Postpartum Care After Cesarean Delivery This sheet gives you information about how to care for yourself from the time you deliver your baby to up to 6-12 weeks after delivery (postpartum period). Your health care provider may also give you more specific instructions. If you have problems or questions, contact your health care provider. Follow these instructions at home: Medicines  Recommend taking motrin 600mg  every 6 hours as needed for pain, tylenol 650mg  every 6 hours as needed for pain. If your pain does not improve then take oxycodone 5mg  every 4-6 hours as needed.  Take colace or miralax to make sure you are not constipated, especially from the oxycodone and iron  Take iron pills daily  Take procardia 30mg  every day  Ask your health care provider if the medicine prescribed to you: ? Requires you to avoid driving or using heavy machinery. ? Can cause constipation. You may need to take actions to prevent or treat constipation, such as:  Drink enough fluid to keep your urine pale yellow.  Take over-the-counter or prescription medicines.  Eat foods that are high in fiber, such as beans, whole grains, and fresh fruits and vegetables.  Limit foods that are high in fat and processed sugars, such as fried or sweet foods. Activity  Gradually return to your normal activities as told by your health care provider.  Avoid activities that take a lot of effort and energy (are strenuous) until approved by your health care provider. Walking at a slow to moderate pace is usually safe. Ask your health care provider what  activities are safe for you. ? Do not lift anything that is heavier than your baby or 10 lb (4.5 kg) as told by your health care provider. ? Do not vacuum, climb stairs, or drive a car for as long as told by your health care provider.  If possible, have someone help you at home until you are able to do your usual activities yourself.  Rest as much as possible. Try to rest or take naps while your baby is sleeping. Vaginal bleeding  It is normal to have vaginal bleeding (lochia) after delivery. Wear a sanitary pad to absorb vaginal bleeding and discharge. ? During the first week after delivery, the amount and appearance of lochia is often similar to a menstrual period. ? Over the next few weeks, it will gradually decrease to a dry, yellow-brown discharge. ? For most women, lochia stops completely by 4-6 weeks after delivery. Vaginal bleeding can vary from woman to woman.  Change your sanitary pads frequently. Watch for any changes in your flow, such as: ? A sudden increase in volume. ? A change in color. ? Large blood clots.  If you pass a blood clot, save it and call your health care provider to discuss. Do not flush blood clots down the toilet before you get instructions from your health care provider.  Do not use tampons or douches until your health care provider says this is safe.  If you are not breastfeeding, your period should return 6-8 weeks after delivery. If you are breastfeeding, your period may return anytime between 8  weeks after delivery and the time that you stop breastfeeding. Perineal care   If your C-section (Cesarean section) was unplanned, and you were allowed to labor and push before delivery, you may have pain, swelling, and discomfort of the tissue between your vaginal opening and your anus (perineum). You may also have an incision in the tissue (episiotomy) or the tissue may have torn during delivery. Follow these instructions as told by your health care  provider: ? Keep your perineum clean and dry as told by your health care provider. Use medicated pads and pain-relieving sprays and creams as directed. ? If you have an episiotomy or vaginal tear, check the area every day for signs of infection. Check for:  Redness, swelling, or pain.  Fluid or blood.  Warmth.  Pus or a bad smell. ? You may be given a squirt bottle to use instead of wiping to clean the perineum area after you go to the bathroom. As you start healing, you may use the squirt bottle before wiping yourself. Make sure to wipe gently. ? To relieve pain caused by an episiotomy, vaginal tear, or hemorrhoids, try taking a warm sitz bath 2-3 times a day. A sitz bath is a warm water bath that is taken while you are sitting down. The water should only come up to your hips and should cover your buttocks. Breast care  Within the first few days after delivery, your breasts may feel heavy, full, and uncomfortable (breast engorgement). You may also have milk leaking from your breasts. Your health care provider can suggest ways to help relieve breast discomfort. Breast engorgement should go away within a few days.  If you are breastfeeding: ? Wear a bra that supports your breasts and fits you well. ? Keep your nipples clean and dry. Apply creams and ointments as told by your health care provider. ? You may need to use breast pads to absorb milk leakage. ? You may have uterine contractions every time you breastfeed for several weeks after delivery. Uterine contractions help your uterus return to its normal size. ? If you have any problems with breastfeeding, work with your health care provider or a Science writer.  If you are not breastfeeding: ? Avoid touching your breasts as this can make your breasts produce more milk. ? Wear a well-fitting bra and use cold packs to help with swelling. ? Do not squeeze out (express) milk. This causes you to make more milk. Intimacy and  sexuality  Ask your health care provider when you can engage in sexual activity. This may depend on your: ? Risk of infection. ? Healing rate. ? Comfort and desire to engage in sexual activity.  You are able to get pregnant after delivery, even if you have not had your period. If desired, talk with your health care provider about methods of family planning or birth control (contraception). Lifestyle  Do not use any products that contain nicotine or tobacco, such as cigarettes, e-cigarettes, and chewing tobacco. If you need help quitting, ask your health care provider.  Do not drink alcohol, especially if you are breastfeeding. Eating and drinking   Drink enough fluid to keep your urine pale yellow.  Eat high-fiber foods every day. These may help prevent or relieve constipation. High-fiber foods include: ? Whole grain cereals and breads. ? Brown rice. ? Beans. ? Fresh fruits and vegetables.  Take your prenatal vitamins until your postpartum checkup or until your health care provider tells you it is okay to stop. General  instructions  Keep all follow-up visits for you and your baby as told by your health care provider. Most women visit their health care provider for a postpartum checkup within the first 3-6 weeks after delivery. Contact a health care provider if you:  Feel unable to cope with the changes that a new baby brings to your life, and these feelings do not go away.  Feel unusually sad or worried.  Have breasts that are painful, hard, or turn red.  Have a fever.  Have trouble holding urine or keeping urine from leaking.  Have little or no interest in activities you used to enjoy.  Have not breastfed at all and you have not had a menstrual period for 12 weeks after delivery.  Have stopped breastfeeding and you have not had a menstrual period for 12 weeks after you stopped breastfeeding.  Have questions about caring for yourself or your baby.  Pass a blood clot  from your vagina. Get help right away if you:  Have chest pain.  Have difficulty breathing.  Have sudden, severe leg pain.  Have severe pain or cramping in your abdomen.  Bleed from your vagina so much that you fill more than one sanitary pad in one hour. Bleeding should not be heavier than your heaviest period.  Develop a severe headache.  Faint.  Have blurred vision or spots in your vision.  Have a bad-smelling vaginal discharge.  Have thoughts about hurting yourself or your baby. If you ever feel like you may hurt yourself or others, or have thoughts about taking your own life, get help right away. You can go to your nearest emergency department or call:  Your local emergency services (911 in the [place]  A suicide crisis helpline, such as the National Suicide Prevention Lifeline at (503)535-7347. This is open 24 hours a day. Summary  The period of time from when you deliver your baby to up to 6-12 weeks after delivery is called the postpartum period.  Gradually return to your normal activities as told by your health care provider.  Keep all follow-up visits for you and your baby as told by your health care provider. This information is not intended to replace advice given to you by your health care provider. Make sure you discuss any questions you have with your health care provider. Document Released: 07/06/2000 Document Revised: 02/26/2018 Document Reviewed: 02/26/2018 Elsevier Patient Education  2020 [employer].

## 2019-04-14 NOTE — Progress Notes (Signed)
Discharge instructions complete with patient. All questions answered. Patient discharged home with family.

## 2019-04-14 NOTE — Lactation Note (Signed)
This note was copied from a baby's chart. Lactation Consultation Note  Patient Name: Emily [name] Date: 04/14/2019 Reason for consult: Follow-up assessment  P1 mother whose infant is now 63 hours old.  Mother's feeding preference is breast/bottle but has been primarily bottle feeding with Similac until this last feed at 1100.  Mother states she would like to breast feed, however, has declined assistance yesterday and today.  She was happy to report that she pumped 36 mls of EBM this morning and baby fed 30 mls at 1145.  Praised mother for being consistent with her pumping and feeding baby breast milk.  Baby was asleep in her arms when I arrived.  Explained to mother that I am available to assist with the breast feeding if she desires.  Mother does not seem very excited to have assistance and seems to be leaning more to pumping and bottle feeding.  I informed her that I was very happy to see that amount of breast milk and I would be willing to assist in any way she desires.  Discussed volume guidelines for supplementation.  Also reminded mother to always latch to the breast prior to giving any supplementation.  Mother has a DEBP for home use.  Father present.  RN in room.   Maternal Data    Feeding Feeding Type: Breast Milk Nipple Type: Slow - flow  LATCH Score                   Interventions    Lactation Tools Discussed/Used     Consult Status Consult Status: Follow-up Date: 04/15/19 Follow-up type: In-patient    Abdoulie Tierce R Jahnai Slingerland 04/14/2019, 1:05 PM

## 2019-04-14 NOTE — Discharge Summary (Signed)
OB Discharge Summary     Patient Name: Emily [name] DOB: February 08, 1991 MRN: [id number]  Date of admission: 04/10/2019 Delivering MD: Malva Limes E   Date of discharge: 04/14/2019  Admitting diagnosis: INDUCTION Intrauterine pregnancy: [redacted]w[redacted]d     Secondary diagnosis:  Active Problems:   Gestational hypertension   Pregnancy   Acute blood loss anemia   Postpartum hemorrhage  Additional problems: none     Discharge diagnosis: Term Pregnancy Delivered, Preeclampsia (severe), PPH and ABLA                                                                                                Post partum procedures:curettage   Augmentation: AROM, Pitocin and Cytotec  Complications: Hemorrhage>1047mL  Hospital course:  Induction of Labor With Cesarean Section  28 y.o. yo G1P0 at [redacted]w[redacted]d was admitted to the hospital 04/10/2019 for induction of labor 2/2 preeclampsia. During her L&D course she developed severe pree by BP which required IV treatment and she was given magnesium. Patient had a labor course significant for cytotec, pitocin and arom, she then had a failed IOL due to no cervical change for 12 hours despite pit and AROM, she arrested at 4cm. The patient went for cesarean section due to Arrest of Dilation, and delivered a Viable infant,04/11/2019  Membrane Rupture Time/Date: 12:07 AM ,04/11/2019   Details of operation can be found in separate operative Note.  Patient had postpartum course complicated by postpartum hemorrhage 2/2 atony, she had a D&C on POD#1. Initially she had received postpartum magnesium but it was discontinued after approx 15 hours in the setting of postpartum hemorrhage. After her D&C she received 2 doses of methergine. On 9/21 her BP were elevated and procardia 30 xL was started. In addition her hgb was 6.2, but she was asymptomatic so blood transfusion was deferred. On POD#3 sp CS/POD#2 sp D&C she was ambulating, tolerating a regular diet, passing flatus, and urinating well. Her  repeat hgb was 7.2, and Bps remained normotensive-mild range. Patient is discharged home in stable condition on 04/14/19 with plan to follow up in 1 week for a BP check. Honey comb dressing in place.                                    Physical exam  Vitals:   04/14/19 0315 04/14/19 0745 04/14/19 1202 04/14/19 1514  BP: 139/75 125/75 (!) 142/88 134/83  Pulse: 96 89 97 93  Resp: 17 18  18   Temp: 98.5 F (36.9 C) 98.3 F (36.8 C) 98.3 F (36.8 C) 98.3 F (36.8 C)  TempSrc: Oral Oral Oral Oral  SpO2:  98% 98% 100%  Weight:      Height:       General: alert Lochia: appropriate Uterine Fundus: firm Incision: Dressing is clean, dry, and intact DVT Evaluation: No evidence of DVT seen on physical exam. Labs: Lab Results  Component Value Date   WBC 16.9 (H) 04/14/2019   HGB 7.2 (L) 04/14/2019   HCT 20.2 (L) 04/14/2019   MCV  95.3 04/14/2019   PLT 379 04/14/2019   CMP Latest Ref Rng & Units 04/10/2019  Glucose 70 - 99 mg/dL 97  BUN 6 - 20 mg/dL <5(L)  Creatinine 0.44 - 1.00 mg/dL 0.84  Sodium 135 - 145 mmol/L 138  Potassium 3.5 - 5.1 mmol/L 2.8(L)  Chloride 98 - 111 mmol/L 105  CO2 22 - 32 mmol/L 21(L)  Calcium 8.9 - 10.3 mg/dL 9.3  Total Protein 6.5 - 8.1 g/dL 6.4(L)  Total Bilirubin 0.3 - 1.2 mg/dL 0.6  Alkaline Phos 38 - 126 U/L 111  AST 15 - 41 U/L 17  ALT 0 - 44 U/L 16    Discharge instruction: per After Visit Summary and "Baby and Me Booklet".  After visit meds:  Allergies as of 04/14/2019      Reactions   Amoxicillin Hives   Fish Allergy Hives   Shellfish Allergy Anaphylaxis   Pt states she has used iodine on her skin before without reaction.   Sulfa Antibiotics Hives   Azithromycin    Other reaction(s): GI Upset (intolerance)      Medication List    STOP taking these medications   acetaminophen 500 MG tablet Commonly known as: TYLENOL     TAKE these medications   calcium carbonate 500 MG chewable tablet Commonly known as: TUMS - dosed in mg elemental  calcium Chew 2 tablets by mouth as needed for indigestion or heartburn.   diphenhydrAMINE 25 MG tablet Commonly known as: BENADRYL Take 25 mg by mouth at bedtime as needed for allergies or sleep.   ferrous sulfate 325 (65 FE) MG tablet Take 1 tablet (325 mg total) by mouth daily with breakfast. Start taking on: April 15, 2019   NIFEdipine 30 MG 24 hr tablet Commonly known as: ADALAT CC Take 1 tablet (30 mg total) by mouth daily.   oxyCODONE 5 MG immediate release tablet Commonly known as: Oxy IR/ROXICODONE Take 1-2 tablets (5-10 mg total) by mouth every 4 (four) hours as needed for severe pain.   PRENATAL VITAMINS PLUS PO Take 1 tablet by mouth daily.   psyllium 58.6 % packet Commonly known as: METAMUCIL Take 1 packet by mouth as needed (constipation).            Discharge Care Instructions  (From admission, onward)         Start     Ordered   04/14/19 0000  Discharge wound care:    Comments: Remove dressing 1 week after CS   04/14/19 1522          Diet: routine diet  Activity: Advance as tolerated. Pelvic rest for 6 weeks.   Outpatient follow up:1 week Follow up Appt:No future appointments. Follow up Visit:No follow-ups on file.  Postpartum contraception: discuss at postpartum visit  Newborn Data: Live born female  Birth Weight: 5 lb 2.7 oz (2345 g) APGAR: 8, 9  Newborn Delivery   Birth date/time: 04/11/2019 23:25:00 Delivery type: C-Section, Low Transverse Trial of labor: Yes C-section categorization: Primary      Baby Feeding: breast & bottle Disposition:home with mother   04/14/2019 Jonelle Sidle, MD

## 2019-04-15 NOTE — Op Note (Signed)
Emily [name], BUIST MEDICAL RECORD UJ:[id number] ACCOUNT [id number] DATE OF BIRTH:1990-11-03 FACILITY: [facility] LOCATION: [facility] PHYSICIAN:MARK Kathline Magic, MD  OPERATIVE REPORT  DATE OF PROCEDURE:  04/12/2019  PREOPERATIVE DIAGNOSES:  Uterine atony leading to large hematometra.  POSTOPERATIVE DIAGNOSES:  Uterine atony leading to large hematometra.  PROCEDURE:  Dilation and evacuation.  SURGEON:  Freda Munro, MD  ANESTHESIA:  Spinal.  ANTIBIOTICS:  Ancef 2 g.  DRAINS:  Foley, bedside drainage.  ESTIMATED BLOOD LOSS:  700 mL.  SPECIMENS:  None.  INDICATIONS:  The patient is a 28 year old female who underwent a cesarean section for failure to progress.  The following day, she was noted to have passed a clot the size of a lemon.  On examination, her uterine fundus was 3 cm above the umbilicus.  On  pelvic exam, clots could be felt through the os, but could not be evacuated.  The patient was given Methergine and Pitocin, and she continued to be unable to pass the clots and bled intermittently.  It was felt that the uterus needed to be evacuated in  order for bleeding to be contained and adequate tone obtained.  This reasoning for this procedure was explained to the patient and her husband.  She was taken to the operating room for evacuation.  PROCEDURE IN DETAIL:  The patient was taken to the operating room where spinal anesthetic was administered.  She was then placed in dorsal lithotomy position.  She was prepped and draped in the usual fashion for this procedure.  When the examination was  performed on the vagina, a grapefruit-size blood clot was seen in the vagina and removed.  The 14 mm curette was then placed into the uterine cavity and passed 3 times with removal of clots.  The banjo curette was then passed throughout the uterine  cavity.  No evidence of any products of conception was found.  A vacuum was then again performed with no more blood clots being removed.  At this  point, no significant bleeding was noted.  She was given Pitocin through her IV and taken to the recovery  room.  She tolerated the procedure well.  Instrument and lap counts correct x2.  LN/NUANCE  D:04/14/2019 T:04/14/2019 JOB:[id number]

## 2019-08-07 ENCOUNTER — Encounter (HOSPITAL_COMMUNITY): Payer: Self-pay | Admitting: Obstetrics and Gynecology

## 2020-05-02 IMAGING — US US OB < 14 WEEKS - US OB TV
1 series · 15 of 28 positions shown · non-contrast
Comparison: None.

CLINICAL DATA: Right lower quadrant pain. Estimated gestational age
by LMP is 9 weeks 2 days. No given quantitative beta HCG.

EXAM:
OBSTETRIC <14 WK US AND TRANSVAGINAL OB US
TECHNIQUE: Both transabdominal and transvaginal ultrasound examinations were
performed for complete evaluation of the gestation as well as the
maternal uterus, adnexal regions, and pelvic cul-de-sac.
Transvaginal technique was performed to assess early pregnancy.

[Series 1: us ob < 14 weeks - us ob tv · 15 of 33 slices shown]
[im 1/33]
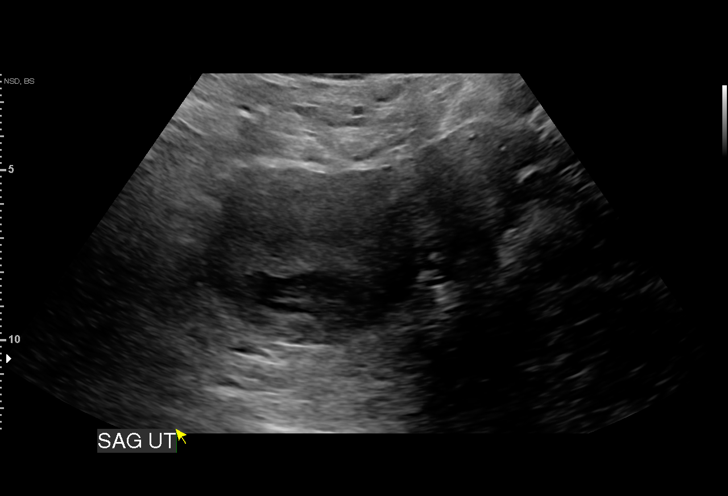
[im 3/33]
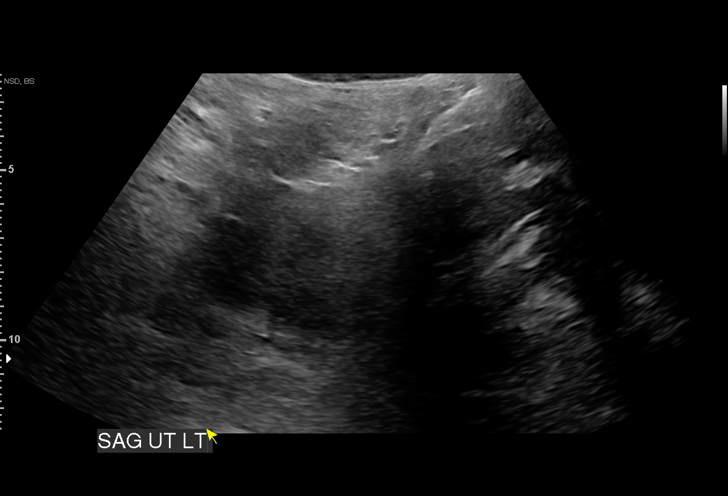
[im 5/33]
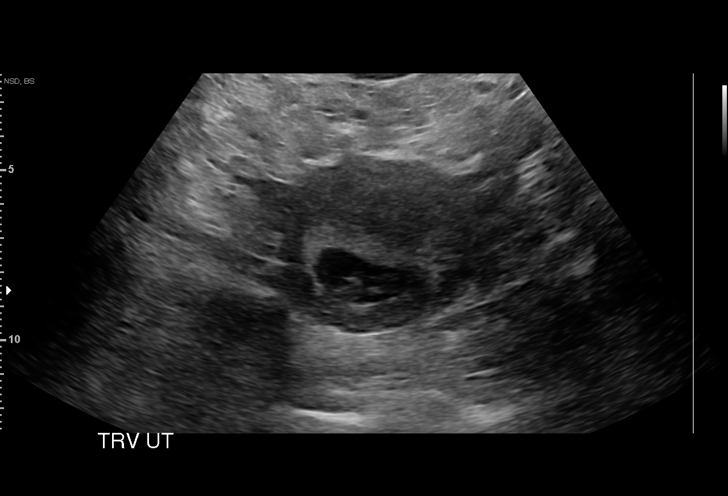
[im 8/33]
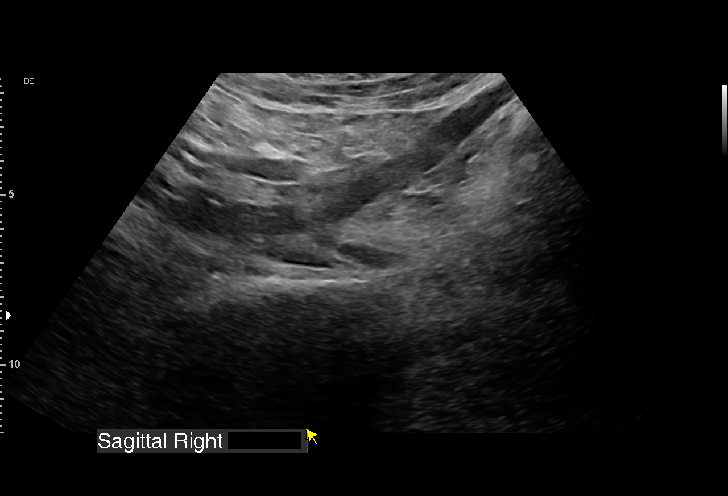
[im 10/33]
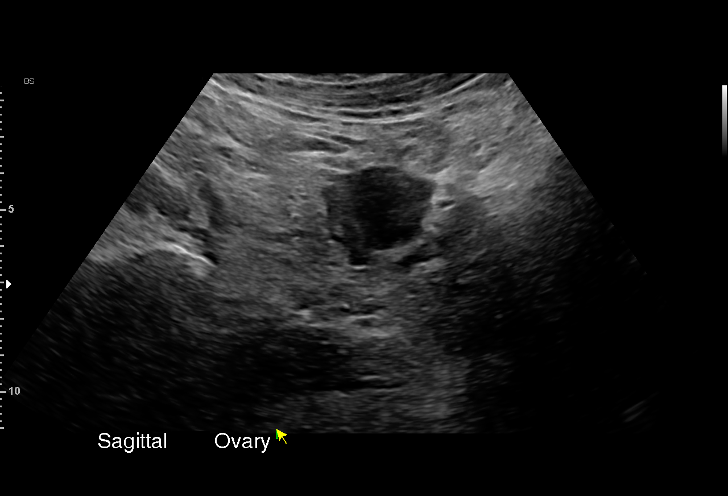
[im 12/33]
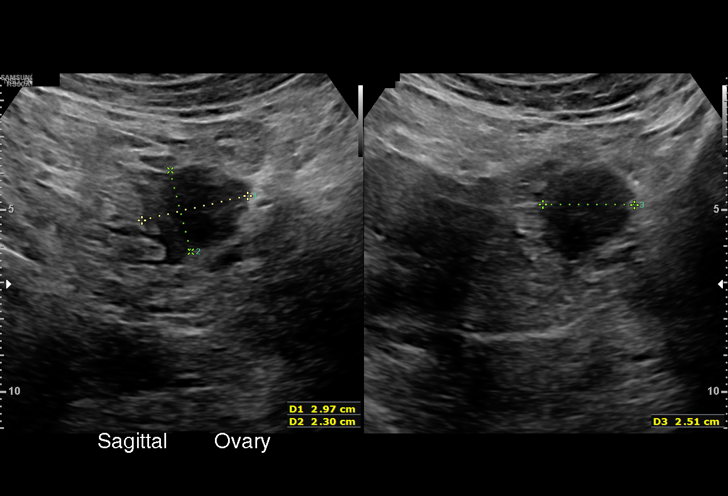
[im 15/33]
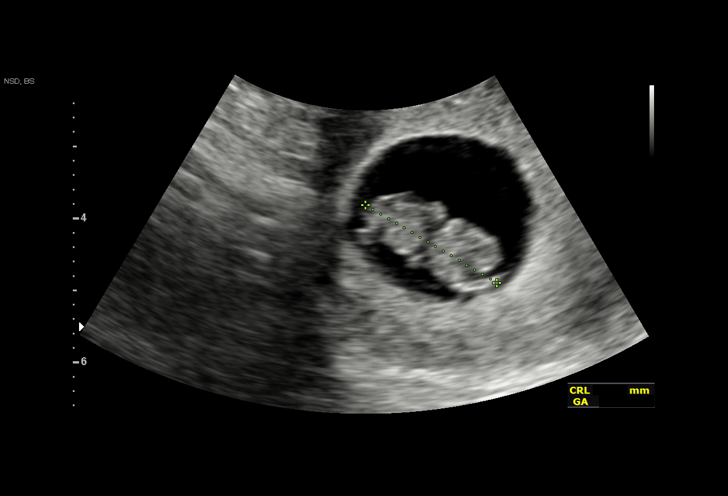
[im 17/33]
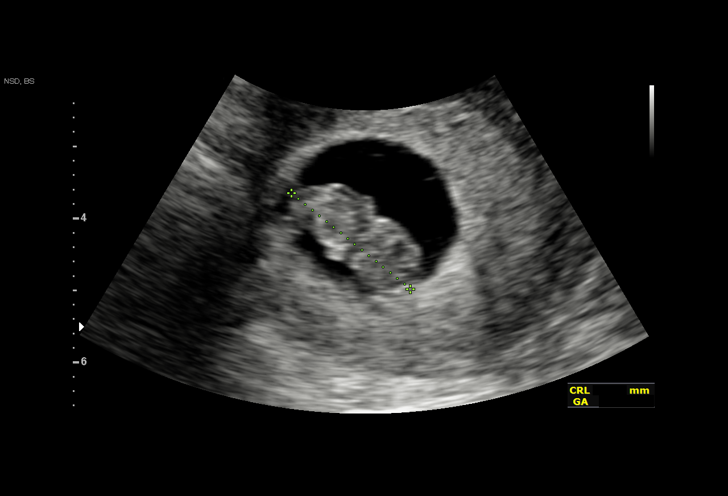
[im 18/33]
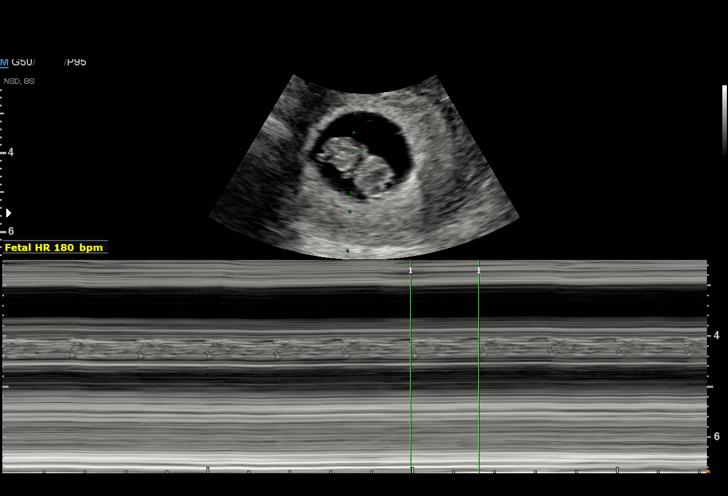
[im 21/33]
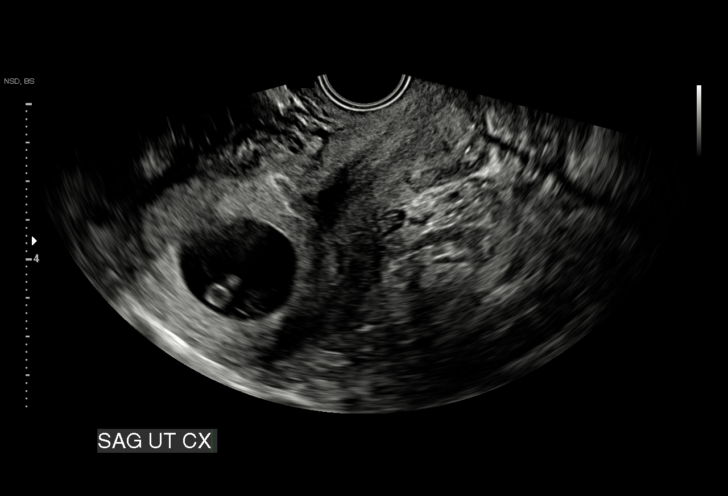
[im 23/33]
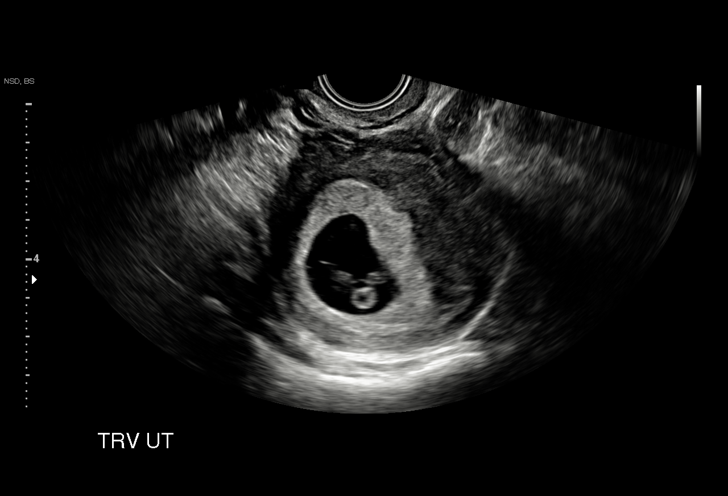
[im 25/33]
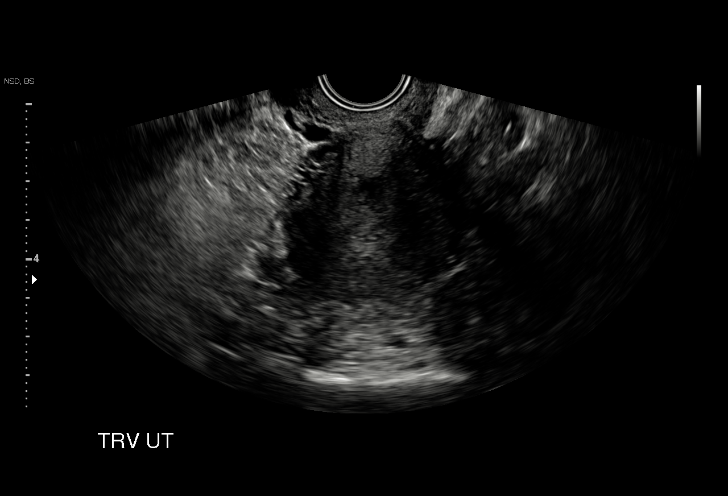
[im 28/33]
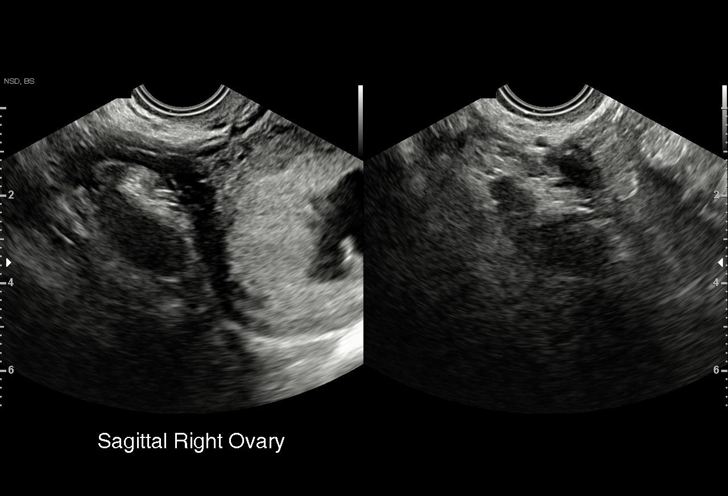
[im 30/33]
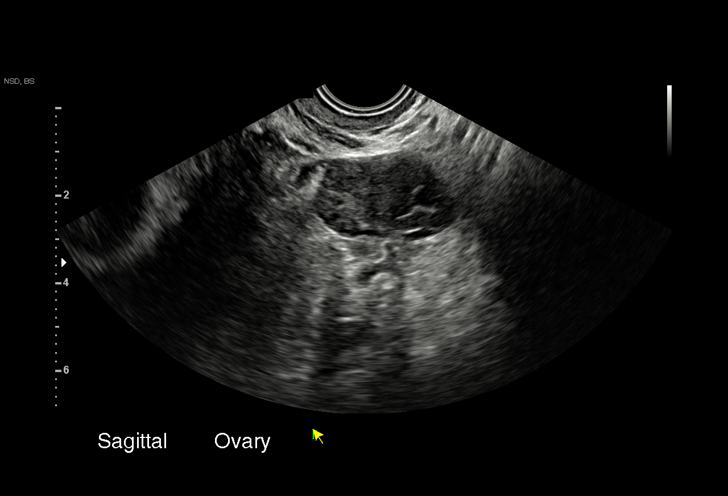
[im 33/33]
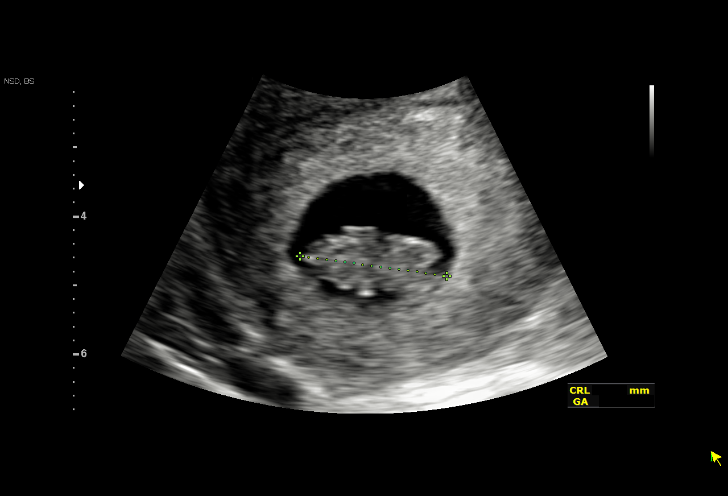

[15 of 28 positions shown; findings below may reference images not displayed]

FINDINGS: Intrauterine gestational sac: A single intrauterine gestational sac
is present.

Yolk sac:  Yolk sac is present.

Embryo:  Fetal pole is demonstrated.

Cardiac Activity: Fetal cardiac activity is observed.

Heart Rate: 180 bpm

CRL: 21 mm   8 w   5 d                  US EDC: 05/05/2019

Subchorionic hemorrhage:  None visualized.

Maternal uterus/adnexae: Uterus is retroverted. No myometrial mass
lesions indicated. Both ovaries are visualized and appear normal. No
abnormal pelvic fluid.
IMPRESSION: Single intrauterine pregnancy. Estimated gestational age by
crown-rump length is 8 weeks 5 days. No acute complication is
suggested on ultrasound.

## 2023-01-11 ENCOUNTER — Emergency Department (HOSPITAL_BASED_OUTPATIENT_CLINIC_OR_DEPARTMENT_OTHER): Payer: Medicaid Other | Admitting: Radiology

## 2023-01-11 ENCOUNTER — Encounter (HOSPITAL_BASED_OUTPATIENT_CLINIC_OR_DEPARTMENT_OTHER): Payer: Self-pay | Admitting: Emergency Medicine

## 2023-01-11 ENCOUNTER — Other Ambulatory Visit: Payer: Self-pay

## 2023-01-11 DIAGNOSIS — I10 Essential (primary) hypertension: Secondary | ICD-10-CM | POA: Insufficient documentation

## 2023-01-11 DIAGNOSIS — Z79899 Other long term (current) drug therapy: Secondary | ICD-10-CM | POA: Insufficient documentation

## 2023-01-11 DIAGNOSIS — R0789 Other chest pain: Secondary | ICD-10-CM | POA: Diagnosis not present

## 2023-01-11 DIAGNOSIS — R0600 Dyspnea, unspecified: Secondary | ICD-10-CM | POA: Insufficient documentation

## 2023-01-11 DIAGNOSIS — G4700 Insomnia, unspecified: Secondary | ICD-10-CM | POA: Diagnosis not present

## 2023-01-11 DIAGNOSIS — R0602 Shortness of breath: Secondary | ICD-10-CM | POA: Diagnosis not present

## 2023-01-11 LAB — CBC WITH DIFFERENTIAL/PLATELET
Abs Immature Granulocytes: 0.02 K/uL (ref 0.00–0.07)
Basophils Absolute: 0 K/uL (ref 0.0–0.1)
Basophils Relative: 0 %
Eosinophils Absolute: 0.1 K/uL (ref 0.0–0.5)
Eosinophils Relative: 1 %
HCT: 43.3 % (ref 36.0–46.0)
Hemoglobin: 15 g/dL (ref 12.0–15.0)
Immature Granulocytes: 0 %
Lymphocytes Relative: 30 %
Lymphs Abs: 2.7 K/uL (ref 0.7–4.0)
MCH: 31.5 pg (ref 26.0–34.0)
MCHC: 34.6 g/dL (ref 30.0–36.0)
MCV: 91 fL (ref 80.0–100.0)
Monocytes Absolute: 0.5 K/uL (ref 0.1–1.0)
Monocytes Relative: 6 %
Neutro Abs: 5.6 K/uL (ref 1.7–7.7)
Neutrophils Relative %: 63 %
Platelets: 284 K/uL (ref 150–400)
RBC: 4.76 MIL/uL (ref 3.87–5.11)
RDW: 11.9 % (ref 11.5–15.5)
WBC: 8.9 K/uL (ref 4.0–10.5)
nRBC: 0 % (ref 0.0–0.2)

## 2023-01-11 LAB — COMPREHENSIVE METABOLIC PANEL WITH GFR
ALT: 24 U/L (ref 0–44)
AST: 18 U/L (ref 15–41)
Albumin: 4.7 g/dL (ref 3.5–5.0)
Alkaline Phosphatase: 58 U/L (ref 38–126)
Anion gap: 9 (ref 5–15)
BUN: 11 mg/dL (ref 6–20)
CO2: 27 mmol/L (ref 22–32)
Calcium: 9.8 mg/dL (ref 8.9–10.3)
Chloride: 102 mmol/L (ref 98–111)
Creatinine, Ser: 0.89 mg/dL (ref 0.44–1.00)
GFR, Estimated: >60 mL/min
Glucose, Bld: 112 mg/dL — ABNORMAL HIGH (ref 70–99)
Potassium: 4.2 mmol/L (ref 3.5–5.1)
Sodium: 138 mmol/L (ref 135–145)
Total Bilirubin: 0.3 mg/dL (ref 0.3–1.2)
Total Protein: 7.7 g/dL (ref 6.5–8.1)

## 2023-01-11 NOTE — ED Triage Notes (Addendum)
Pt presents to ED POV. Pt c/o SOB and chest tightness x53m . Pt reports that she was treated for costochondritis and got better but s/s have worsened again.

## 2023-01-12 ENCOUNTER — Emergency Department (HOSPITAL_BASED_OUTPATIENT_CLINIC_OR_DEPARTMENT_OTHER)
Admission: EM | Admit: 2023-01-12 | Discharge: 2023-01-12 | Disposition: A | Discharge status: Home / Self Care | Payer: Medicaid Other | Attending: Emergency Medicine | Admitting: Emergency Medicine

## 2023-01-12 DIAGNOSIS — R0600 Dyspnea, unspecified: Secondary | ICD-10-CM

## 2023-01-12 DIAGNOSIS — G4700 Insomnia, unspecified: Secondary | ICD-10-CM

## 2023-01-12 MED ORDER — HYDROXYZINE HCL 10 MG PO TABS: 10.0000 mg | ORAL_TABLET | Freq: Two times a day (BID) | ORAL | 0 refills | Status: AC | PRN

## 2023-01-12 NOTE — ED Notes (Signed)
Discharge instructions discussed with pt. Pt verbalized understanding. Pt stable and ambulatory.  °

## 2023-01-12 NOTE — ED Provider Notes (Signed)
[place] EMERGENCY DEPARTMENT AT [facility] Provider Note   CSN: [id number] Arrival date & time: 01/11/23  2221     History  Chief Complaint  Patient presents with   Shortness of Breath    Emily [name] is a 32 y.o. female.  32 year old female who presents ER today with persistent dyspnea.  Patient was diagnosed with costochondritis about a month ago after having 3 ER visits at [facility].  They did multiple test including PET scans, EKGs, troponins, x-rays.  Intercostal lidocaine injection resolved the pain but she still has episodes of shortness of breath.  She is not sure if there is any that triggers it.  She states that she noticed that she cannot get a big deep breath and then subsequently she will get more more short of breath and then she will get anxious about it and then it will last for variable amount of time.  No fevers or cough.  She states that she quit smoking about 7 months ago and started vaping and she quit vaping about a month ago.  No lower extremity swelling.  No history of heart disease.  Has some chest pressure but not consistently.  Has a referral to pulmonology in [place] in September.  Has tried Vistaril for her anxiety.  Does have some yawning throughout the day, persistently sleepy.  Some high blood pressure.  Husband states that she does snore a lot at night.  Does have episodes of what sound like apnea.   Shortness of Breath      Home Medications Prior to Admission medications   Medication Sig Start Date End Date Taking? Authorizing Provider  hydrOXYzine (ATARAX) 10 MG tablet Take 1-2 tablets (10-20 mg total) by mouth every 12 (twelve) hours as needed for anxiety. 01/12/23  Yes Kutler Vanvranken, Barbara Cower, MD  calcium carbonate (TUMS - DOSED IN MG ELEMENTAL CALCIUM) 500 MG chewable tablet Chew 2 tablets by mouth as needed for indigestion or heartburn.    [provider]  ferrous sulfate 325 (65 FE) MG tablet Take 1 tablet (325 mg total) by  mouth daily with breakfast. 04/15/19   Taam-Akelman, Griselda Miner, MD  NIFEdipine (ADALAT CC) 30 MG 24 hr tablet Take 1 tablet (30 mg total) by mouth daily. 04/14/19   Taam-Akelman, Griselda Miner, MD  Prenatal Vit-Fe Fumarate-FA (PRENATAL VITAMINS PLUS PO) Take 1 tablet by mouth daily.    [provider]  psyllium (METAMUCIL) 58.6 % packet Take 1 packet by mouth as needed (constipation).    [provider]      Allergies    Amoxicillin, Fish allergy, Shellfish allergy, Sulfa antibiotics, Azithromycin, and Beef-derived products    Review of Systems   Review of Systems  Respiratory:  Positive for shortness of breath.     Physical Exam Updated Vital Signs BP 109/71   Pulse (!) 56   Temp 98 F (36.7 C) (Oral)   Resp 13   SpO2 93%  Physical Exam Vitals and nursing note reviewed.  Constitutional:      Appearance: She is well-developed.  HENT:     Head: Normocephalic and atraumatic.  Cardiovascular:     Rate and Rhythm: Normal rate and regular rhythm.  Pulmonary:     Effort: No tachypnea or respiratory distress.     Breath sounds: No stridor. No decreased breath sounds, wheezing or rales.  Abdominal:     General: There is no distension.  Musculoskeletal:     Cervical back: Normal range of motion.  Neurological:  Mental Status: She is alert.     ED Results / Procedures / Treatments   Labs (all labs ordered are listed, but only abnormal results are displayed) Labs Reviewed  COMPREHENSIVE METABOLIC PANEL - Abnormal; Notable for the following components:      Result Value   Glucose, Bld 112 (*)    All other components within normal limits  CBC WITH DIFFERENTIAL/PLATELET    EKG EKG Interpretation  Date/Time:  Friday January 11 2023 22:48:50 EDT Ventricular Rate:  71 PR Interval:  162 QRS Duration: 80 QT Interval:  394 QTC Calculation: 428 R Axis:   75 Text Interpretation: Normal sinus rhythm with sinus arrhythmia Normal ECG No previous ECGs available  Confirmed by Marily Memos [phone]) on 01/11/2023 10:59:13 PM  Radiology DG Chest 2 View  Result Date: 01/11/2023 CLINICAL DATA:  Shortness of breath and chest tightness. EXAM: CHEST - 2 VIEW COMPARISON:  None Available. FINDINGS: The heart size and mediastinal contours are within normal limits. Both lungs are clear. Radiopaque surgical clips are seen within the right upper quadrant. The visualized skeletal structures are unremarkable. IMPRESSION: No active cardiopulmonary disease. Electronically Signed   By: Aram Candela M.D.   On: 01/11/2023 23:46    Procedures Procedures    Medications Ordered in ED Medications - No data to display  ED Course/ Medical Decision Making/ A&P                             Medical Decision Making Amount and/or Complexity of Data Reviewed Labs: ordered. Radiology: ordered.  Risk Prescription drug management.   Unclear etiology of her symptoms.  It is unlikely to be life-threatening as she had a large workup done a month ago and nothing is changed in fact is actually gotten a little bit better since then.  I wonder if she has obstructive sleep apnea causing some of her symptoms.  Also could be anxiety.  Will refer to pulmonology for sleep study however with her chest pressure once in a while will also refer to cardiology for possible echocardiogram.  No evidence of pneumothorax, blood clot, pneumonia or other emergent etiology at this time.  Final Clinical Impression(s) / ED Diagnoses Final diagnoses:  Dyspnea, unspecified type  Insomnia, unspecified type    Rx / DC Orders ED Discharge Orders          Ordered    hydrOXYzine (ATARAX) 10 MG tablet  Every 12 hours PRN        01/12/23 0333    Ambulatory referral to Pulmonology        01/12/23 0333    Ambulatory referral to Cardiology       Comments: If PCP can't order echo for shortness of breath and chest pain   01/12/23 0333              Melania Kirks, Barbara Cower, MD 01/12/23 [phone]

## 2023-03-06 DIAGNOSIS — R0602 Shortness of breath: Secondary | ICD-10-CM | POA: Insufficient documentation

## 2023-03-06 DIAGNOSIS — R072 Precordial pain: Secondary | ICD-10-CM | POA: Insufficient documentation

## 2023-03-06 NOTE — Progress Notes (Unsigned)
Cardiology Office Note   Date:  03/07/2023   ID:  Emily [name], DOB 08-02-1990, MRN [id number]  PCP:  Patient, No Pcp Per  Cardiologist:   None Referring:  Mesner, Barbara Cower, MD  Chief Complaint  Patient presents with   Shortness of Breath     History of Present Illness: Emily [name] is a 32 y.o. female who presents for evaluation of SOB.  This happens sporadically.  She describes episodes of chest can wake her from her sleep or happen during the day.  She is feels like she cannot take a deep breath.  It might set her into a panic attack but she does not think it is starting as a panic attack.  She cannot bring it on.  She does not really notice a trigger.  She is pretty sedentary in her activities.  She might do a little grocery shopping and that does not bring it on.  She is not describing chest discomfort, neck or arm discomfort.    She is not having palpitations or presyncope or syncope.  I do see some blood work to include normal electrolytes and TSH.  She has had recent ED visits for evaluation of chest pain.  She was thought  to have costochondritis.  However, she is not currently describing this.  She has not had any other prior cardiac workup.   Past Medical History:  Diagnosis Date   Pregnancy induced hypertension     Past Surgical History:  Procedure Laterality Date   CESAREAN SECTION N/A 04/11/2019   Procedure: CESAREAN SECTION;  Surgeon: Levi Aland, MD;  Location: [facility] LD ORS;  Service: Obstetrics;  Laterality: N/A;   CHOLECYSTECTOMY     DILATION AND EVACUATION N/A 04/12/2019   Procedure: DILATATION AND EVACUATION;  Surgeon: Levi Aland, MD;  Location: [facility] LD ORS;  Service: Gynecology;  Laterality: N/A;     Current Outpatient Medications  Medication Sig Dispense Refill   calcium carbonate (TUMS - DOSED IN MG ELEMENTAL CALCIUM) 500 MG chewable tablet Chew 2 tablets by mouth as needed for indigestion or heartburn. (Patient not taking: Reported on 03/07/2023)      ferrous sulfate 325 (65 FE) MG tablet Take 1 tablet (325 mg total) by mouth daily with breakfast. (Patient not taking: Reported on 03/07/2023) 30 tablet 11   hydrOXYzine (ATARAX) 10 MG tablet Take 1-2 tablets (10-20 mg total) by mouth every 12 (twelve) hours as needed for anxiety. (Patient not taking: Reported on 03/07/2023) 30 tablet 0   NIFEdipine (ADALAT CC) 30 MG 24 hr tablet Take 1 tablet (30 mg total) by mouth daily. (Patient not taking: Reported on 03/07/2023) 30 tablet 11   Prenatal Vit-Fe Fumarate-FA (PRENATAL VITAMINS PLUS PO) Take 1 tablet by mouth daily. (Patient not taking: Reported on 03/07/2023)     psyllium (METAMUCIL) 58.6 % packet Take 1 packet by mouth as needed (constipation). (Patient not taking: Reported on 03/07/2023)     No current facility-administered medications for this visit.    Allergies:   Amoxicillin, Fish allergy, Shellfish allergy, Sulfa antibiotics, Azithromycin, and Beef-derived products    Social History:  The patient  reports that she has been smoking cigarettes. She has never used smokeless tobacco. She reports that she does not currently use alcohol. She reports that she does not use drugs.   Family History:  The patient's family history is not on file. She was adopted.    ROS:  Please see the history of present illness.   Otherwise, review  of systems are positive for none.   All other systems are reviewed and negative.    PHYSICAL EXAM: VS:  BP 112/74   Pulse 91   Ht 5\' 9"  (1.753 m)   Wt 192 lb 6.4 oz (87.3 kg)   LMP 03/04/2023   SpO2 99%   BMI 28.41 kg/m  , BMI Body mass index is 28.41 kg/m. GENERAL:  Well appearing HEENT:  Pupils equal round and reactive, fundi not visualized, oral mucosa unremarkable NECK:  No jugular venous distention, waveform within normal limits, carotid upstroke brisk and symmetric, no bruits, no thyromegaly LYMPHATICS:  No cervical, inguinal adenopathy LUNGS:  Clear to auscultation bilaterally BACK:  No CVA  tenderness CHEST:  Unremarkable HEART:  PMI not displaced or sustained,S1 and S2 within normal limits, no S3, no S4, no clicks, no rubs, no murmurs ABD:  Flat, positive bowel sounds normal in frequency in pitch, no bruits, no rebound, no guarding, no midline pulsatile mass, no hepatomegaly, no splenomegaly EXT:  2 plus pulses throughout, no edema, no cyanosis no clubbing SKIN:  No rashes no nodules NEURO:  Cranial nerves II through XII grossly intact, motor grossly intact throughout PSYCH:  Cognitively intact, oriented to person place and time    EKG:   NSR, rate 71, axis WNL, no acute ST T wave changes.  01/11/23    Recent Labs: 01/11/2023: ALT 24; BUN 11; Creatinine, Ser 0.89; Hemoglobin 15.0; Platelets 284; Potassium 4.2; Sodium 138    Lipid Panel No results found for: "CHOL", "TRIG", "HDL", "CHOLHDL", "VLDL", "LDLCALC", "LDLDIRECT"    Wt Readings from Last 3 Encounters:  03/07/23 192 lb 6.4 oz (87.3 kg)  04/10/19 207 lb 12.8 oz (94.3 kg)  09/28/18 176 lb (79.8 kg)      Other studies Reviewed: Additional studies/ records that were reviewed today include: ED records. Review of the above records demonstrates:  Please see elsewhere in the note.     ASSESSMENT AND PLAN:  SOB:   Her episodes are vague.  I do not suspect structural heart disease but I will screen with a BNP.  On the outside chance that this is a rhythm issue I will check a two day Zio.  She is going to see a pulmonologist.  If the monitor looks OK I am not planning on ordering other cardiovascular testing.     Current medicines are reviewed at length with the patient today.  The patient does not have concerns regarding medicines.  The following changes have been made:  no change  Labs/ tests ordered today include:   Orders Placed This Encounter  Procedures   B Nat Peptide   LONG TERM MONITOR (3-14 DAYS)     Disposition:   FU with me as needed based on the results of the above.     Signed, Rollene Rotunda, MD  03/07/2023 5:15 PM    [facility]    Normal limits, intervals within normal limits, no acute ST-T wave changes, rate 71

## 2023-03-07 ENCOUNTER — Ambulatory Visit: Payer: Medicaid Other | Attending: Cardiology | Admitting: Cardiology

## 2023-03-07 ENCOUNTER — Encounter: Payer: Self-pay | Admitting: Cardiology

## 2023-03-07 VITALS — BP 112/74 | HR 91 | Ht 69.0 in | Wt 192.4 lb

## 2023-03-07 DIAGNOSIS — R072 Precordial pain: Secondary | ICD-10-CM

## 2023-03-07 DIAGNOSIS — R0602 Shortness of breath: Secondary | ICD-10-CM | POA: Diagnosis not present

## 2023-03-07 DIAGNOSIS — R002 Palpitations: Secondary | ICD-10-CM | POA: Diagnosis not present

## 2023-03-07 NOTE — Patient Instructions (Addendum)
Medication Instructions:  Your physician recommends that you continue on your current medications as directed. Please refer to the Current Medication list given to you today.  *If you need a refill on your cardiac medications before your next appointment, please call your pharmacy*   Lab Work: Your physician recommends that you return for lab work as soon as able - BNP.     Follow-Up: At [facility], you and your health needs are our priority.  As part of our continuing mission to provide you with exceptional heart care, we have created designated Provider Care Teams.  These Care Teams include your primary Cardiologist (physician) and Advanced Practice Providers (APPs -  Physician Assistants and Nurse Practitioners) who all work together to provide you with the care you need, when you need it.  We recommend signing up for the patient portal called "MyChart".  Sign up information is provided on this After Visit Summary.  MyChart is used to connect with patients for Virtual Visits (Telemedicine).  Patients are able to view lab/test results, encounter notes, upcoming appointments, etc.  Non-urgent messages can be sent to your provider as well.   To learn more about what you can do with MyChart, go to [email or web address].    Your next appointment:   As needed pending monitor results   Provider:   Dr. Antoine Poche  Other Instructions  ZIO XT- Long Term Monitor Instructions  Your physician has requested you wear a ZIO patch monitor for 14 days.  This is a single patch monitor. [employer] supplies one patch monitor per enrollment. Additional stickers are not available. Please do not apply patch if you will be having a Nuclear Stress Test,  Echocardiogram, Cardiac CT, MRI, or Chest Xray during the period you would be wearing the  monitor. The patch cannot be worn during these tests. You cannot remove and re-apply the  ZIO XT patch monitor.  Your ZIO patch monitor will be mailed 3 day  USPS to your address on file. It may take 3-5 days  to receive your monitor after you have been enrolled.  Once you have received your monitor, please review the enclosed instructions. Your monitor  has already been registered assigning a specific monitor serial # to you.  Billing and Patient Assistance Program Information  We have supplied [employer] with any of your insurance information on file for billing purposes. [employer] offers a sliding scale Patient Assistance Program for patients that do not have  insurance, or whose insurance does not completely cover the cost of the ZIO monitor.  You must apply for the Patient Assistance Program to qualify for this discounted rate.  To apply, please call [employer] at [phone], select option 4, select option 2, ask to apply for  Patient Assistance Program. Meredeth Ide will ask your household income, and how many people  are in your household. They will quote your out-of-pocket cost based on that information.  [employer] will also be able to set up a 32-month, interest-free payment plan if needed.  Applying the monitor   Shave hair from upper left chest.  Hold abrader disc by orange tab. Rub abrader in 40 strokes over the upper left chest as  indicated in your monitor instructions.  Clean area with 4 enclosed alcohol pads. Let dry.  Apply patch as indicated in monitor instructions. Patch will be placed under collarbone on left  side of chest with arrow pointing upward.  Rub patch adhesive wings for 2 minutes. Remove white label marked "1". Remove the white  label marked "2". Rub patch adhesive wings for 2 additional minutes.  While looking in a mirror, press and release button in center of patch. A small green light will  flash 3-4 times. This will be your only indicator that the monitor has been turned on.  Do not shower for the first 24 hours. You may shower after the first 24 hours.  Press the button if you feel a symptom. You will hear a small  click. Record Date, Time and  Symptom in the Patient Logbook.  When you are ready to remove the patch, follow instructions on the last 2 pages of Patient  Logbook. Stick patch monitor onto the last page of Patient Logbook.  Place Patient Logbook in the blue and white box. Use locking tab on box and tape box closed  securely. The blue and white box has prepaid postage on it. Please place it in the mailbox as  soon as possible. Your physician should have your test results approximately 7 days after the  monitor has been mailed back to [facility].  Call [facility] Customer Care at [phone] if you have questions regarding  your ZIO XT patch monitor. Call them immediately if you see an orange light blinking on your  monitor.  If your monitor falls off in less than 4 days, contact our Monitor department at [phone].  If your monitor becomes loose or falls off after 4 days call [employer] at [phone] for  suggestions on securing your monitor

## 2023-03-08 ENCOUNTER — Ambulatory Visit: Payer: Medicaid Other | Attending: Cardiology

## 2023-03-08 DIAGNOSIS — R002 Palpitations: Secondary | ICD-10-CM

## 2023-03-08 NOTE — Progress Notes (Unsigned)
Enrolled patient for a 14 day Zio XT  monitor to be mailed to patients home  °

## 2023-03-11 DIAGNOSIS — R002 Palpitations: Secondary | ICD-10-CM | POA: Diagnosis not present

## 2023-03-28 LAB — BRAIN NATRIURETIC PEPTIDE: BNP: 16.3 pg/mL (ref 0.0–100.0)
